# Patient Record
Sex: Female | Born: 1975 | Race: Asian | Hispanic: No | Marital: Married | Smoking: Never smoker
Health system: Southern US, Community
[De-identification: ages and names within clinical notes are randomized; demographics above are authoritative.]

## PROBLEM LIST (undated history)

## (undated) DIAGNOSIS — R7989 Other specified abnormal findings of blood chemistry: Secondary | ICD-10-CM

## (undated) DIAGNOSIS — R945 Abnormal results of liver function studies: Secondary | ICD-10-CM

## (undated) DIAGNOSIS — K8020 Calculus of gallbladder without cholecystitis without obstruction: Secondary | ICD-10-CM

## (undated) DIAGNOSIS — B1920 Unspecified viral hepatitis C without hepatic coma: Secondary | ICD-10-CM

## (undated) DIAGNOSIS — D509 Iron deficiency anemia, unspecified: Secondary | ICD-10-CM

## (undated) DIAGNOSIS — K635 Polyp of colon: Secondary | ICD-10-CM

## (undated) DIAGNOSIS — K219 Gastro-esophageal reflux disease without esophagitis: Secondary | ICD-10-CM

## (undated) HISTORY — DX: Polyp of colon: K63.5

## (undated) HISTORY — DX: Gastro-esophageal reflux disease without esophagitis: K21.9

## (undated) HISTORY — DX: Iron deficiency anemia, unspecified: D50.9

## (undated) HISTORY — DX: Other specified abnormal findings of blood chemistry: R79.89

## (undated) HISTORY — DX: Unspecified viral hepatitis C without hepatic coma: B19.20

## (undated) HISTORY — DX: Abnormal results of liver function studies: R94.5

## (undated) HISTORY — DX: Calculus of gallbladder without cholecystitis without obstruction: K80.20

---

## 1999-04-09 ENCOUNTER — Other Ambulatory Visit: Admission: RE | Admit: 1999-04-09 | Discharge: 1999-04-09 | Payer: Self-pay | Admitting: Obstetrics

## 1999-04-22 ENCOUNTER — Inpatient Hospital Stay (HOSPITAL_COMMUNITY): Admission: AD | Admit: 1999-04-22 | Discharge: 1999-04-22 | Payer: Self-pay | Admitting: Obstetrics

## 1999-08-27 ENCOUNTER — Inpatient Hospital Stay (HOSPITAL_COMMUNITY): Admission: AD | Admit: 1999-08-27 | Discharge: 1999-08-29 | Payer: Self-pay | Admitting: Obstetrics

## 2000-08-30 ENCOUNTER — Other Ambulatory Visit: Admission: RE | Admit: 2000-08-30 | Discharge: 2000-08-30 | Payer: Self-pay | Admitting: Obstetrics

## 2000-11-22 ENCOUNTER — Inpatient Hospital Stay (HOSPITAL_COMMUNITY): Admission: AD | Admit: 2000-11-22 | Discharge: 2000-11-22 | Payer: Self-pay | Admitting: Obstetrics

## 2001-01-17 ENCOUNTER — Inpatient Hospital Stay (HOSPITAL_COMMUNITY): Admission: AD | Admit: 2001-01-17 | Discharge: 2001-01-19 | Payer: Self-pay | Admitting: Obstetrics

## 2012-11-22 ENCOUNTER — Ambulatory Visit (INDEPENDENT_AMBULATORY_CARE_PROVIDER_SITE_OTHER): Payer: BC Managed Care – PPO | Admitting: Physician Assistant

## 2012-11-22 VITALS — BP 108/71 | HR 100 | Temp 98.3°F | Resp 17 | Ht 62.5 in | Wt 119.0 lb

## 2012-11-22 DIAGNOSIS — IMO0001 Reserved for inherently not codable concepts without codable children: Secondary | ICD-10-CM

## 2012-11-22 DIAGNOSIS — M791 Myalgia, unspecified site: Secondary | ICD-10-CM

## 2012-11-22 DIAGNOSIS — R509 Fever, unspecified: Secondary | ICD-10-CM

## 2012-11-22 LAB — POCT CBC
HCT, POC: 40.3 % (ref 37.7–47.9)
Hemoglobin: 12.4 g/dL (ref 12.2–16.2)
MCH, POC: 27.7 pg (ref 27–31.2)
MCV: 89.9 fL (ref 80–97)
MPV: 9.7 fL (ref 0–99.8)
RBC: 4.48 M/uL (ref 4.04–5.48)
WBC: 7.7 10*3/uL (ref 4.6–10.2)

## 2012-11-22 MED ORDER — DOXYCYCLINE HYCLATE 100 MG PO CAPS: 100.0000 mg | ORAL_CAPSULE | Freq: Two times a day (BID) | ORAL | Status: DC

## 2012-11-22 NOTE — Patient Instructions (Addendum)
Begin taking the antibiotic as directed.  Be sure to finish the full course.  It is possible that you have a viral illness that will resolve on its own, but just to be cautious we are treating you for tick borne diseases.  If any symptoms are worsening or not improving, please let us know.

## 2012-11-22 NOTE — Progress Notes (Signed)
Subjective:    Patient ID: Heidi Schneider, female    DOB: 1975/10/15, 37 y.o.   MRN: [id number]  HPI   Heidi Schneider is a pleasant 37 yr old female here with concern for illness.  States that she has had 3 days of body aches, back pain, abdominal pain, and fever.  Last night tmax 101F.  Also with chills and sweats.  All symptoms began abruptly 3 days ago.  Has intermittent HA as well.  Was camping in [place] this past weekend.  Multiple mosquito bites but no known tick bites.  Denis cough, SOB.  No nausea or vomiting, just aching in her stomach.  Appetite is down.  Has tried Dayquil and Nyquil for symptoms which helped slightly.  No rash.  No other sick contacts.  Daughter had been sick but is now better, but had different symptoms, no fever.     Review of Systems  Constitutional: Positive for fever, chills, diaphoresis and fatigue.  Respiratory: Negative for cough, shortness of breath and wheezing.   Cardiovascular: Negative.   Gastrointestinal: Positive for abdominal pain. Negative for nausea, vomiting and diarrhea.  Musculoskeletal: Positive for myalgias and arthralgias.  Skin: Negative for rash.  Neurological: Positive for headaches.       Objective:   Physical Exam  Vitals reviewed. Constitutional: She is oriented to person, place, and time. She appears well-developed and well-nourished. No distress.  HENT:  Head: Normocephalic and atraumatic.  Mouth/Throat: Uvula is midline, oropharynx is clear and moist and mucous membranes are normal.  Eyes: Conjunctivae are normal. No scleral icterus.  Neck: Neck supple.  Cardiovascular: Normal rate, regular rhythm and normal heart sounds.   Pulmonary/Chest: Effort normal and breath sounds normal. She has no wheezes. She has no rales.  Abdominal: Soft. Bowel sounds are normal. There is no tenderness.  Lymphadenopathy:    She has no cervical adenopathy.    She has no axillary adenopathy.  Neurological: She is alert and oriented to person, place, and time.   Skin: Skin is warm and dry.  Multiple insect bites on legs and back  Psychiatric: She has a normal mood and affect. Her behavior is normal.     Results for orders placed in visit on 11/22/12  POCT CBC      Result Value Range   WBC 7.7  4.6 - 10.2 K/uL   Lymph, poc 1.6  0.6 - 3.4   POC LYMPH PERCENT 20.7  10 - 50 %L   MID (cbc) 0.3  0 - 0.9   POC MID % 4.1  0 - 12 %M   POC Granulocyte 5.8  2 - 6.9   Granulocyte percent 75.2  37 - 80 %G   RBC 4.48  4.04 - 5.48 M/uL   Hemoglobin 12.4  12.2 - 16.2 g/dL   HCT, POC 66.0  63.0 - 47.9 %   MCV 89.9  80 - 97 fL   MCH, POC 27.7  27 - 31.2 pg   MCHC 30.8 (*) 31.8 - 35.4 g/dL   RDW, POC 16.0     Platelet Count, POC 220  142 - 424 K/uL   MPV 9.7  0 - 99.8 fL         Assessment & Plan:  Fever, unspecified - Plan: POCT CBC, doxycycline (VIBRAMYCIN) 100 MG capsule  Myalgia - Plan: POCT CBC, doxycycline (VIBRAMYCIN) 100 MG capsule   Heidi Schneider is a 37 yr old female with 3 days of fatigue, myalgia/arthralgia, and fever after camping in [place]  6 days ago.  Multiple mosquito bites but no known tick bite.  She is afebrile in clinic and VSS.  CBC is normal today.  Sxs could be attributable to a viral syndrome, but will be cautious and cover her for tick borne illness with doxy BID x 7 days.  Discussed RTC precautions with pt - specifically persistent fever, new symptoms.  Pt understands and is in agreement with this plan.

## 2013-08-14 ENCOUNTER — Ambulatory Visit (INDEPENDENT_AMBULATORY_CARE_PROVIDER_SITE_OTHER): Payer: BC Managed Care – PPO | Admitting: Family Medicine

## 2013-08-14 VITALS — BP 112/68 | HR 99 | Temp 98.4°F | Resp 17 | Ht 62.0 in | Wt 121.0 lb

## 2013-08-14 DIAGNOSIS — Z23 Encounter for immunization: Secondary | ICD-10-CM

## 2013-08-14 DIAGNOSIS — Z789 Other specified health status: Secondary | ICD-10-CM

## 2013-08-14 DIAGNOSIS — Z130 Encounter for screening for diseases of the blood and blood-forming organs and certain disorders involving the immune mechanism: Secondary | ICD-10-CM

## 2013-08-14 DIAGNOSIS — Z13228 Encounter for screening for other metabolic disorders: Secondary | ICD-10-CM

## 2013-08-14 DIAGNOSIS — Z1329 Encounter for screening for other suspected endocrine disorder: Secondary | ICD-10-CM

## 2013-08-14 NOTE — Progress Notes (Signed)
° °  Subjective:    Patient ID: Heidi Schneider, female    DOB: September 20, 1975, 38 y.o.   MRN: [id number] This chart was scribed for Robyn Haber, MD by Anastasia Pall, ED Scribe. This patient was seen in room 10 and the patient's care was started at 6:59 PM.  Chief Complaint  Patient presents with   Immunizations    hep c, TDAP   HPI Heidi Schneider is a 38 y.o. female Pt presents to the [facility] for Hep-A and TDAP immunizations. She denies h/o the same immunizations. She reports he is leaving the country to go to [place] in a few weeks. She denies any other requests, any symptoms.   PCP - No PCP Per Patient  There are no active problems to display for this patient.  Prior to Admission medications   Not on File   Review of Systems  Constitutional: Negative for fever and activity change.   BP 112/68   Pulse 99   Temp(Src) 98.4 F (36.9 C) (Oral)   Resp 17   Ht 5\' 2"  (1.575 m)   Wt 121 lb (54.885 kg)   BMI 22.13 kg/m2   SpO2 100%   LMP 08/07/2013     Objective:   Physical Exam Nursing note and vitals reviewed. Constitutional: She is oriented to person, place, and time. She appears well-developed and well-nourished. No distress.  HENT:  Head: Normocephalic and atraumatic.  Eyes: EOM are normal.  Neck: Neck supple.  Cardiovascular: Normal rate.   Pulmonary/Chest: Effort normal. No respiratory distress.  Musculoskeletal: Normal range of motion.  Neurological: She is alert and oriented to person, place, and time.  Skin: Skin is warm and dry.  Psychiatric: She has a normal mood and affect. Her behavior is normal.      Assessment & Plan:   Immunization due - Plan: Hepatitis A vaccine adult IM, Tdap vaccine greater than or equal to 7yo IM  Patient travels - Plan: Hepatitis A vaccine adult IM, Tdap vaccine greater than or equal to 7yo IM  Signed, Robyn Haber, MD

## 2013-08-14 NOTE — Patient Instructions (Signed)
Hepatitis A Hepatitis A antibody is produced in response to an infection with the hepatitis A virus or to the hepatitis A vaccine. The test detects the presence of this antibody. This is used to diagnose an infection with hepatitis A or to evaluate the need for or response to the hepatitis A vaccine. It is done if you have symptoms of an infection with and/or have been exposed to the hepatitis A virus (HAV); if you have chronic liver disease; or if you have received the HAV vaccine  PREPARATION FOR TEST A blood sample is obtained by inserting a needle into a vein in the arm. NORMAL FINDINGS Negative. Ranges for normal findings may bary among different laboratories and hospitals. You should always check with your doctor after having lab work or other tests done to discuss the meaning of your test results and whether your values are considered within normal limits. MEANING OF TEST  Your caregiver will go over the test results with you and discuss the importance and meaning of your results, as well as treatment options and the need for additional tests if necessary. OBTAINING THE TEST RESULTS It is your responsibility to obtain your test results. Ask the lab or department performing the test when and how you will get your results. Document Released: 06/11/2004 Document Revised: 08/09/2011 Document Reviewed: 04/27/2008 [facility] Patient Information 2014 [place], [place]. Immunization Information for Foreign Travel Immunizations can protect you from certain diseases. Immunizations can also prevent the spread of certain infections. It is important to see your caregiver or a travel medicine specialist 4 6 weeks before you travel. This allows time for vaccines to take effect. It also provides enough time for you to get vaccines that must be given in a series over a period of days or weeks. Immunizations for travelers include:  Routine vaccines. These vaccines are standard for the people in a  country.  Recommended vaccines. These vaccines are recommended before travel to some countries or regions.  Required vaccines. These vaccines are necessary before travel to specific countries or regions. If it is less than 4 weeks before you leave, you should still see your caregiver. You might still benefit from vaccines or medicines. WHAT ARE THE ROUTINE VACCINES? Routine vaccines can protect you from diseases that are common in many parts of the world. Most routine vaccines are given at specific ages during your life. However, routine vaccines also include the annual flu (influenza) vaccine. You should be up-to-date on your routine immunizations before you travel. Your caregiver will be able to review your vaccine history and determine whether you have had all the routine vaccines. You may be advised to get extra doses or booster vaccines even if you are up-to-date on the routine vaccines. WHAT ARE THE RECOMMENDED VACCINES? Know your travel schedule when you visit your caregiver. The vaccines recommended before foreign travel will depend on several factors, including:  The country or countries of travel.  Whether you will travel to rural areas.  The length of time you will be traveling.  The season of the year.  Your age.  Your health status.  Your previous immunizations. Vaccine recommendations change over time. Your caregiver can tell you what vaccines are recommended before your trip. The annual influenza vaccine sometimes differs for the [place] and [place] hemispheres. Unless the annual vaccines are the same in both hemispheres, people with certain chronic medical conditions who are traveling to the other hemisphere shortly before or during the influenza season should also get the other  influenza vaccine. The other influenza vaccine should be obtained either before leaving the country or shortly after arrival at the travel site. WHAT ARE THE REQUIRED VACCINES? Vaccines may be  required during a current outbreak of an infectious disease in a country or region. Your caregiver will be able to tell you about any current outbreaks and required vaccines. For example, proof of yellow fever immunization is currently required for most people before traveling to certain countries in [place] and [place]. This vaccine can only be obtained at approved centers. You should get the yellow fever vaccine at least 10 days before your trip. After 10 days, most people show immunity to yellow fever. If it has been longer than 10 years since you received the yellow fever vaccine, another dose is required. If proof of immunization is incomplete or inaccurate, you could be quarantined, denied entry, or given another dose of vaccine at the travel site. If you cannot receive the yellow fever vaccine because of medical reasons, you must have a written statement from your caregiver. The statement must contain a medical reason for the lack of immunization. In such a case, your caregiver should then give you advice on how to decrease your chance of getting yellow fever. That advice should include taking precautions to avoid mosquito bites and limiting outdoor time. Other than having a medical condition or being under the age of 75 months, no other reasons will be accepted for not getting the vaccine.  Proof of meningococcal immunization is required by the [place] for any person older than 2 years who is taking part in the [place] or [place]. Visas for traveling to the [place] or [place] will not even be issued until there is proof of immunization. You should get this vaccine at least 10 days before your trip. After 10 days, most people show immunity. If it has been longer than 3 years since your last immunization, another dose is required. FOR MORE INFORMATION  Centers for Disease Control and Prevention (CDC): http://www.wolf.info/  World Health Organization [facility]): RoleLink.com.br Document Released:  05/05/2009 Document Revised: 05/03/2012 Document Reviewed: 04/14/2012 [facility] Patient Information 2014 [place], [place].

## 2014-02-13 ENCOUNTER — Ambulatory Visit (INDEPENDENT_AMBULATORY_CARE_PROVIDER_SITE_OTHER): Payer: BC Managed Care – PPO

## 2014-02-13 DIAGNOSIS — Z283 Underimmunization status: Secondary | ICD-10-CM

## 2014-02-13 DIAGNOSIS — Z2839 Other underimmunization status: Secondary | ICD-10-CM

## 2014-02-13 DIAGNOSIS — Z23 Encounter for immunization: Secondary | ICD-10-CM

## 2014-02-13 NOTE — Progress Notes (Signed)
   Subjective:    Patient ID: Heidi Schneider, female    DOB: 04-26-1976, 38 y.o.   MRN: [id number]  HPI Patient was here for her 2nd Hep A shot.     Review of Systems     Objective:   Physical Exam        Assessment & Plan:

## 2014-04-26 ENCOUNTER — Ambulatory Visit (INDEPENDENT_AMBULATORY_CARE_PROVIDER_SITE_OTHER): Payer: BC Managed Care – PPO | Admitting: Family Medicine

## 2014-04-26 VITALS — BP 126/84 | HR 94 | Temp 98.1°F | Resp 16 | Ht 62.0 in | Wt 124.0 lb

## 2014-04-26 DIAGNOSIS — N390 Urinary tract infection, site not specified: Secondary | ICD-10-CM

## 2014-04-26 DIAGNOSIS — R1011 Right upper quadrant pain: Secondary | ICD-10-CM

## 2014-04-26 DIAGNOSIS — R8281 Pyuria: Secondary | ICD-10-CM

## 2014-04-26 DIAGNOSIS — R1013 Epigastric pain: Secondary | ICD-10-CM

## 2014-04-26 DIAGNOSIS — K219 Gastro-esophageal reflux disease without esophagitis: Secondary | ICD-10-CM

## 2014-04-26 DIAGNOSIS — R1012 Left upper quadrant pain: Secondary | ICD-10-CM

## 2014-04-26 LAB — POCT URINALYSIS DIPSTICK
BILIRUBIN UA: NEGATIVE
Blood, UA: NEGATIVE
GLUCOSE UA: NEGATIVE
KETONES UA: NEGATIVE
NITRITE UA: NEGATIVE
PH UA: 6.5
Protein, UA: NEGATIVE
Spec Grav, UA: 1.02
Urobilinogen, UA: 0.2

## 2014-04-26 LAB — COMPREHENSIVE METABOLIC PANEL
ALBUMIN: 4.1 g/dL (ref 3.5–5.2)
ALK PHOS: 211 U/L — AB (ref 39–117)
ALT: 204 U/L — AB (ref 0–35)
AST: 79 U/L — AB (ref 0–37)
BUN: 10 mg/dL (ref 6–23)
CALCIUM: 9.1 mg/dL (ref 8.4–10.5)
CHLORIDE: 105 meq/L (ref 96–112)
CO2: 27 mEq/L (ref 19–32)
Creat: 0.54 mg/dL (ref 0.50–1.10)
Glucose, Bld: 100 mg/dL — ABNORMAL HIGH (ref 70–99)
POTASSIUM: 4.1 meq/L (ref 3.5–5.3)
SODIUM: 138 meq/L (ref 135–145)
TOTAL PROTEIN: 7.3 g/dL (ref 6.0–8.3)
Total Bilirubin: 0.6 mg/dL (ref 0.2–1.2)

## 2014-04-26 LAB — POCT CBC
Granulocyte percent: 58.9 %G (ref 37–80)
HEMATOCRIT: 31.4 % — AB (ref 37.7–47.9)
HEMOGLOBIN: 9.6 g/dL — AB (ref 12.2–16.2)
LYMPH, POC: 1.6 (ref 0.6–3.4)
MCH: 22.6 pg — AB (ref 27–31.2)
MCHC: 30.7 g/dL — AB (ref 31.8–35.4)
MCV: 73.6 fL — AB (ref 80–97)
MID (cbc): 0.3 (ref 0–0.9)
MPV: 9 fL (ref 0–99.8)
POC Granulocyte: 2.8 (ref 2–6.9)
POC LYMPH PERCENT: 34 %L (ref 10–50)
POC MID %: 7.1 %M (ref 0–12)
Platelet Count, POC: 352 10*3/uL (ref 142–424)
RBC: 4.27 M/uL (ref 4.04–5.48)
RDW, POC: 19.7 %
WBC: 4.8 10*3/uL (ref 4.6–10.2)

## 2014-04-26 LAB — POCT UA - MICROSCOPIC ONLY
CASTS, UR, LPF, POC: NEGATIVE
CRYSTALS, UR, HPF, POC: NEGATIVE
Mucus, UA: NEGATIVE
RBC, urine, microscopic: NEGATIVE
YEAST UA: NEGATIVE

## 2014-04-26 LAB — POCT URINE PREGNANCY: PREG TEST UR: NEGATIVE

## 2014-04-26 LAB — LIPASE: Lipase: 22 U/L (ref 0–75)

## 2014-04-26 MED ORDER — OMEPRAZOLE 20 MG PO CPDR: 20.0000 mg | DELAYED_RELEASE_CAPSULE | Freq: Every day | ORAL | Status: DC

## 2014-04-26 NOTE — Patient Instructions (Addendum)
Your pain appears to be from stomach and heartburn. Start stronger medicine - omeprazole once per day instead of Zantac. Avoid foods below that can worsen heartburn and stomach pain. You should receive a call or letter about your lab results within the next week to 10 days. If pain not improving in next 3-4 days, or worse sooner - Return to the clinic or go to the nearest emergency room.   I will also check a urine culture as a few infection cells in urine, but your symptoms do not appear to be from infection in urine at this time.    Gastritis, Adult Gastritis is soreness and swelling (inflammation) of the lining of the stomach. Gastritis can develop as a sudden onset (acute) or long-term (chronic) condition. If gastritis is not treated, it can lead to stomach bleeding and ulcers. CAUSES  Gastritis occurs when the stomach lining is weak or damaged. Digestive juices from the stomach then inflame the weakened stomach lining. The stomach lining may be weak or damaged due to viral or bacterial infections. One common bacterial infection is the Helicobacter pylori infection. Gastritis can also result from excessive alcohol consumption, taking certain medicines, or having too much acid in the stomach.  SYMPTOMS  In some cases, there are no symptoms. When symptoms are present, they may include:  Pain or a burning sensation in the upper abdomen.  Nausea.  Vomiting.  An uncomfortable feeling of fullness after eating. DIAGNOSIS  Your caregiver may suspect you have gastritis based on your symptoms and a physical exam. To determine the cause of your gastritis, your caregiver may perform the following:  Blood or stool tests to check for the H pylori bacterium.  Gastroscopy. A thin, flexible tube (endoscope) is passed down the esophagus and into the stomach. The endoscope has a light and camera on the end. Your caregiver uses the endoscope to view the inside of the stomach.  Taking a tissue sample  (biopsy) from the stomach to examine under a microscope. TREATMENT  Depending on the cause of your gastritis, medicines may be prescribed. If you have a bacterial infection, such as an H pylori infection, antibiotics may be given. If your gastritis is caused by too much acid in the stomach, H2 blockers or antacids may be given. Your caregiver may recommend that you stop taking aspirin, ibuprofen, or other nonsteroidal anti-inflammatory drugs (NSAIDs). HOME CARE INSTRUCTIONS  Only take over-the-counter or prescription medicines as directed by your caregiver.  If you were given antibiotic medicines, take them as directed. Finish them even if you start to feel better.  Drink enough fluids to keep your urine clear or pale yellow.  Avoid foods and drinks that make your symptoms worse, such as:  Caffeine or alcoholic drinks.  Chocolate.  Peppermint or mint flavorings.  Garlic and onions.  Spicy foods.  Citrus fruits, such as oranges, lemons, or limes.  Tomato-based foods such as sauce, chili, salsa, and pizza.  Fried and fatty foods.  Eat small, frequent meals instead of large meals. SEEK IMMEDIATE MEDICAL CARE IF:   You have black or dark red stools.  You vomit blood or material that looks like coffee grounds.  You are unable to keep fluids down.  Your abdominal pain gets worse.  You have a fever.  You do not feel better after 1 week.  You have any other questions or concerns. MAKE SURE YOU:  Understand these instructions.  Will watch your condition.  Will get help right away if you are  not doing well or get worse. Document Released: 05/11/2001 Document Revised: 11/16/2011 Document Reviewed: 06/30/2011 [facility] Patient Information 2015 [place], [place]. This information is not intended to replace advice given to you by your health care provider. Make sure you discuss any questions you have with your health care provider.  Food Choices for Gastroesophageal Reflux  Disease When you have gastroesophageal reflux disease (GERD), the foods you eat and your eating habits are very important. Choosing the right foods can help ease the discomfort of GERD. WHAT GENERAL GUIDELINES DO I NEED TO FOLLOW?  Choose fruits, vegetables, whole grains, low-fat dairy products, and low-fat meat, fish, and poultry.  Limit fats such as oils, salad dressings, butter, nuts, and avocado.  Keep a food diary to identify foods that cause symptoms.  Avoid foods that cause reflux. These may be different for different people.  Eat frequent small meals instead of three large meals each day.  Eat your meals slowly, in a relaxed setting.  Limit fried foods.  Cook foods using methods other than frying.  Avoid drinking alcohol.  Avoid drinking large amounts of liquids with your meals.  Avoid bending over or lying down until 2-3 hours after eating. WHAT FOODS ARE NOT RECOMMENDED? The following are some foods and drinks that may worsen your symptoms: Vegetables Tomatoes. Tomato juice. Tomato and spaghetti sauce. Chili peppers. Onion and garlic. Horseradish. Fruits Oranges, grapefruit, and lemon (fruit and juice). Meats High-fat meats, fish, and poultry. This includes hot dogs, ribs, ham, sausage, salami, and bacon. Dairy Whole milk and chocolate milk. Sour cream. Cream. Butter. Ice cream. Cream cheese.  Beverages Coffee and tea, with or without caffeine. Carbonated beverages or energy drinks. Condiments Hot sauce. Barbecue sauce.  Sweets/Desserts Chocolate and cocoa. Donuts. Peppermint and spearmint. Fats and Oils High-fat foods, including [place] fries and potato chips. Other Vinegar. Strong spices, such as black pepper, white pepper, red pepper, cayenne, curry powder, cloves, ginger, and chili powder. The items listed above may not be a complete list of foods and beverages to avoid. Contact your dietitian for more information. Document Released: 05/17/2005 Document  Revised: 05/22/2013 Document Reviewed: 03/21/2013 [facility] Patient Information 2015 [place], [place]. This information is not intended to replace advice given to you by your health care provider. Make sure you discuss any questions you have with your health care provider.

## 2014-04-26 NOTE — Progress Notes (Addendum)
Subjective:    Patient ID: Heidi Schneider, female    DOB: 24-Oct-1975, 38 y.o.   MRN: [id number] This chart was scribed for Dr. Merri Ray by Steva Colder, ED Scribe. The patient was seen in room 8 at 1:54 PM.   Chief Complaint  Patient presents with  . Abdominal Pain    x1 week     HPI Heidi Schneider is a 38 y.o. female who presents today complaining of abdominal pain onset 1 week. She thinks that she had an upset stomach and she took Zantec and the stomach acid went away. She tried this method this time and it wouldn't go away. The pain is in the upper abdomen and in the back the pain is worse in the afternoon. She has been able to eat and drink okay. She notes that she has had some heartburn.  She states that she has tried Zantec 1 a day and TUMS with no relief for her symptoms. She denies fever, dark tarry stools, blood in stools, nausea, vomiting, dysuria, weight loss, diaphoresis, and any other associate symptoms. She denies stomach ulcers. She denies any sick contacts with similar issues. Denies being treated for H. Pylori in the past. Denies diet change. No recent travel in the past month. Denies new foods or raw foods lately.  Patient's last menstrual period was 04/11/2014 and was normal, she denies pregnancy.      There are no active problems to display for this patient.  History reviewed. No pertinent past medical history. History reviewed. No pertinent past surgical history. No Known Allergies Prior to Admission medications   Not on File   History   Social History  . Marital Status: Married    Spouse Name: N/A    Number of Children: N/A  . Years of Education: N/A   Occupational History  . Not on file.   Social History Main Topics  . Smoking status: Never Smoker   . Smokeless tobacco: Not on file  . Alcohol Use: No  . Drug Use: No  . Sexual Activity: Yes    Birth Control/ Protection: None   Other Topics Concern  . Not on file   Social History Narrative        Review of Systems  Constitutional: Negative for fever, diaphoresis and unexpected weight change.  Gastrointestinal: Positive for abdominal pain. Negative for nausea, vomiting and blood in stool.       No dark tarry stools  Genitourinary: Negative for dysuria.       Objective:   Physical Exam  Constitutional: She is oriented to person, place, and time. She appears well-developed and well-nourished.  HENT:  Head: Normocephalic and atraumatic.  Eyes: Conjunctivae and EOM are normal. Pupils are equal, round, and reactive to light.  Neck: Carotid bruit is not present.  Cardiovascular: Normal rate, regular rhythm, normal heart sounds and intact distal pulses.   Pulmonary/Chest: Effort normal and breath sounds normal.  Abdominal: Soft. She exhibits no pulsatile midline mass. There is tenderness in the right upper quadrant, epigastric area and left upper quadrant. There is no rebound, no guarding, no tenderness at McBurney's point and negative Murphy's sign.  Slight tenderness over the epigastric to RUQ and slightly LUQ. No lower abdominal tenderness.   Musculoskeletal:  Minimal tenderness of the paraspinal muscles. No midline tenderness or bony tenderness.   Neurological: She is alert and oriented to person, place, and time.  Skin: Skin is warm and dry. No rash noted.  Psychiatric: She has  a normal mood and affect. Her behavior is normal.  Vitals reviewed.        Filed Vitals:   04/26/14 1309  BP: 126/84  Pulse: 94  Temp: 98.1 F (36.7 C)  TempSrc: Oral  Resp: 16  Height: 5\' 2"  (1.575 m)  Weight: 124 lb (56.246 kg)  SpO2: 100%   Results for orders placed or performed in visit on 04/26/14  POCT CBC  Result Value Ref Range   WBC 4.8 4.6 - 10.2 K/uL   Lymph, poc 1.6 0.6 - 3.4   POC LYMPH PERCENT 34.0 10 - 50 %L   MID (cbc) 0.3 0 - 0.9   POC MID % 7.1 0 - 12 %M   POC Granulocyte 2.8 2 - 6.9   Granulocyte percent 58.9 37 - 80 %G   RBC 4.27 4.04 - 5.48 M/uL    Hemoglobin 9.6 (A) 12.2 - 16.2 g/dL   HCT, POC 31.4 (A) 37.7 - 47.9 %   MCV 73.6 (A) 80 - 97 fL   MCH, POC 22.6 (A) 27 - 31.2 pg   MCHC 30.7 (A) 31.8 - 35.4 g/dL   RDW, POC 19.7 %   Platelet Count, POC 352 142 - 424 K/uL   MPV 9.0 0 - 99.8 fL  POCT urinalysis dipstick  Result Value Ref Range   Color, UA yellow    Clarity, UA clear    Glucose, UA neg    Bilirubin, UA neg    Ketones, UA neg    Spec Grav, UA 1.020    Blood, UA neg    pH, UA 6.5    Protein, UA neg    Urobilinogen, UA 0.2    Nitrite, UA neg    Leukocytes, UA small (1+)   POCT UA - Microscopic Only  Result Value Ref Range   WBC, Ur, HPF, POC 2-6    RBC, urine, microscopic neg    Bacteria, U Microscopic 2+    Mucus, UA neg    Epithelial cells, urine per micros 0-3    Crystals, Ur, HPF, POC neg    Casts, Ur, LPF, POC neg    Yeast, UA neg   POCT urine pregnancy  Result Value Ref Range   Preg Test, Ur Negative     Assessment & Plan:  COORDINATION OF CARE: 2:02 PM-Discussed treatment plan which includes CBC, H. Pylori IGM, and UA, with pt at bedside and pt agreed to plan.  [name] is a 38 y.o. female Epigastric abdominal pain - Plan: POCT CBC, POCT urinalysis dipstick, H. pylori antibody, IgG, Comprehensive metabolic panel, Lipase, omeprazole (PRILOSEC) 20 MG capsule, POCT urine pregnancy  RUQ pain - Plan: POCT CBC, POCT urinalysis dipstick, Lipase, POCT UA - Microscopic Only, POCT urine pregnancy  LUQ pain - Plan: POCT CBC, POCT urinalysis dipstick, Lipase, POCT UA - Microscopic Only, POCT urine pregnancy  Gastroesophageal reflux disease, esophagitis presence not specified - Plan: POCT CBC, POCT urinalysis dipstick, H. pylori antibody, IgG, Comprehensive metabolic panel, Lipase, omeprazole (PRILOSEC) 20 MG capsule  Pyuria - Plan: Urine culture  Suspected gastritis with some coexisting GERD. Afebrile. Change H2 blocker to PPI, avoidance of trigger foods as noted on patient instructions, and check lipase,  CMP, and H pylori. Follow up to be determined by labs. RTC/ER precautions discussed.   Pyuria - ? Contaminant as no other UTI sx's. Check culture, hold on antibiotics at this time.   Meds ordered this encounter  Medications  . omeprazole (PRILOSEC) 20 MG capsule  Sig: Take 1 capsule (20 mg total) by mouth daily.    Dispense:  30 capsule    Refill:  1   Patient Instructions  Your pain appears to be from stomach and heartburn. Start stronger medicine - omeprazole once per day instead of Zantac. Avoid foods below that can worsen heartburn and stomach pain. You should receive a call or letter about your lab results within the next week to 10 days. If pain not improving in next 3-4 days, or worse sooner - Return to the clinic or go to the nearest emergency room.   I will also check a urine culture as a few infection cells in urine, but your symptoms do not appear to be from infection in urine at this time.    Gastritis, Adult Gastritis is soreness and swelling (inflammation) of the lining of the stomach. Gastritis can develop as a sudden onset (acute) or long-term (chronic) condition. If gastritis is not treated, it can lead to stomach bleeding and ulcers. CAUSES  Gastritis occurs when the stomach lining is weak or damaged. Digestive juices from the stomach then inflame the weakened stomach lining. The stomach lining may be weak or damaged due to viral or bacterial infections. One common bacterial infection is the Helicobacter pylori infection. Gastritis can also result from excessive alcohol consumption, taking certain medicines, or having too much acid in the stomach.  SYMPTOMS  In some cases, there are no symptoms. When symptoms are present, they may include:  Pain or a burning sensation in the upper abdomen.  Nausea.  Vomiting.  An uncomfortable feeling of fullness after eating. DIAGNOSIS  Your caregiver may suspect you have gastritis based on your symptoms and a physical exam. To  determine the cause of your gastritis, your caregiver may perform the following:  Blood or stool tests to check for the H pylori bacterium.  Gastroscopy. A thin, flexible tube (endoscope) is passed down the esophagus and into the stomach. The endoscope has a light and camera on the end. Your caregiver uses the endoscope to view the inside of the stomach.  Taking a tissue sample (biopsy) from the stomach to examine under a microscope. TREATMENT  Depending on the cause of your gastritis, medicines may be prescribed. If you have a bacterial infection, such as an H pylori infection, antibiotics may be given. If your gastritis is caused by too much acid in the stomach, H2 blockers or antacids may be given. Your caregiver may recommend that you stop taking aspirin, ibuprofen, or other nonsteroidal anti-inflammatory drugs (NSAIDs). HOME CARE INSTRUCTIONS  Only take over-the-counter or prescription medicines as directed by your caregiver.  If you were given antibiotic medicines, take them as directed. Finish them even if you start to feel better.  Drink enough fluids to keep your urine clear or pale yellow.  Avoid foods and drinks that make your symptoms worse, such as:  Caffeine or alcoholic drinks.  Chocolate.  Peppermint or mint flavorings.  Garlic and onions.  Spicy foods.  Citrus fruits, such as oranges, lemons, or limes.  Tomato-based foods such as sauce, chili, salsa, and pizza.  Fried and fatty foods.  Eat small, frequent meals instead of large meals. SEEK IMMEDIATE MEDICAL CARE IF:   You have black or dark red stools.  You vomit blood or material that looks like coffee grounds.  You are unable to keep fluids down.  Your abdominal pain gets worse.  You have a fever.  You do not feel better after 1 week.  You have any other questions or concerns. MAKE SURE YOU:  Understand these instructions.  Will watch your condition.  Will get help right away if you are not  doing well or get worse. Document Released: 05/11/2001 Document Revised: 11/16/2011 Document Reviewed: 06/30/2011 [facility] Patient Information 2015 [place], [place]. This information is not intended to replace advice given to you by your health care provider. Make sure you discuss any questions you have with your health care provider.  Food Choices for Gastroesophageal Reflux Disease When you have gastroesophageal reflux disease (GERD), the foods you eat and your eating habits are very important. Choosing the right foods can help ease the discomfort of GERD. WHAT GENERAL GUIDELINES DO I NEED TO FOLLOW?  Choose fruits, vegetables, whole grains, low-fat dairy products, and low-fat meat, fish, and poultry.  Limit fats such as oils, salad dressings, butter, nuts, and avocado.  Keep a food diary to identify foods that cause symptoms.  Avoid foods that cause reflux. These may be different for different people.  Eat frequent small meals instead of three large meals each day.  Eat your meals slowly, in a relaxed setting.  Limit fried foods.  Cook foods using methods other than frying.  Avoid drinking alcohol.  Avoid drinking large amounts of liquids with your meals.  Avoid bending over or lying down until 2-3 hours after eating. WHAT FOODS ARE NOT RECOMMENDED? The following are some foods and drinks that may worsen your symptoms: Vegetables Tomatoes. Tomato juice. Tomato and spaghetti sauce. Chili peppers. Onion and garlic. Horseradish. Fruits Oranges, grapefruit, and lemon (fruit and juice). Meats High-fat meats, fish, and poultry. This includes hot dogs, ribs, ham, sausage, salami, and bacon. Dairy Whole milk and chocolate milk. Sour cream. Cream. Butter. Ice cream. Cream cheese.  Beverages Coffee and tea, with or without caffeine. Carbonated beverages or energy drinks. Condiments Hot sauce. Barbecue sauce.  Sweets/Desserts Chocolate and cocoa. Donuts. Peppermint and  spearmint. Fats and Oils High-fat foods, including [place] fries and potato chips. Other Vinegar. Strong spices, such as black pepper, white pepper, red pepper, cayenne, curry powder, cloves, ginger, and chili powder. The items listed above may not be a complete list of foods and beverages to avoid. Contact your dietitian for more information. Document Released: 05/17/2005 Document Revised: 05/22/2013 Document Reviewed: 03/21/2013 [facility] Patient Information 2015 [place], [place]. This information is not intended to replace advice given to you by your health care provider. Make sure you discuss any questions you have with your health care provider.        I personally performed the services described in this documentation, which was scribed in my presence. The recorded information has been reviewed and considered, and addended by me as needed.

## 2014-04-28 LAB — URINE CULTURE
Colony Count: NO GROWTH
Organism ID, Bacteria: NO GROWTH

## 2014-04-29 LAB — H. PYLORI ANTIBODY, IGG: H Pylori IgG: 0.42 {ISR}

## 2014-05-01 ENCOUNTER — Ambulatory Visit (INDEPENDENT_AMBULATORY_CARE_PROVIDER_SITE_OTHER): Payer: BC Managed Care – PPO | Admitting: Family Medicine

## 2014-05-01 VITALS — BP 109/72 | HR 77 | Temp 98.2°F | Resp 18 | Ht 61.75 in | Wt 123.0 lb

## 2014-05-01 DIAGNOSIS — R7989 Other specified abnormal findings of blood chemistry: Secondary | ICD-10-CM

## 2014-05-01 DIAGNOSIS — Z124 Encounter for screening for malignant neoplasm of cervix: Secondary | ICD-10-CM

## 2014-05-01 DIAGNOSIS — D649 Anemia, unspecified: Secondary | ICD-10-CM

## 2014-05-01 DIAGNOSIS — Z13220 Encounter for screening for lipoid disorders: Secondary | ICD-10-CM

## 2014-05-01 DIAGNOSIS — R945 Abnormal results of liver function studies: Secondary | ICD-10-CM

## 2014-05-01 DIAGNOSIS — Z0000 Encounter for general adult medical examination without abnormal findings: Secondary | ICD-10-CM

## 2014-05-01 DIAGNOSIS — R1013 Epigastric pain: Secondary | ICD-10-CM

## 2014-05-01 LAB — POCT CBC
Granulocyte percent: 51.5 %G (ref 37–80)
HCT, POC: 30 % — AB (ref 37.7–47.9)
Hemoglobin: 9.1 g/dL — AB (ref 12.2–16.2)
Lymph, poc: 1.8 (ref 0.6–3.4)
MCH, POC: 22.3 pg — AB (ref 27–31.2)
MCHC: 30.4 g/dL — AB (ref 31.8–35.4)
MCV: 73.2 fL — AB (ref 80–97)
MID (CBC): 0.2 (ref 0–0.9)
MPV: 8.9 fL (ref 0–99.8)
PLATELET COUNT, POC: 328 10*3/uL (ref 142–424)
POC GRANULOCYTE: 2.2 (ref 2–6.9)
POC LYMPH PERCENT: 42.9 %L (ref 10–50)
POC MID %: 5.6 % (ref 0–12)
RBC: 4.1 M/uL (ref 4.04–5.48)
RDW, POC: 19.7 %
WBC: 4.2 10*3/uL — AB (ref 4.6–10.2)

## 2014-05-01 NOTE — Progress Notes (Addendum)
This chart was scribed for Merri Ray, MD by Edison Simon, ED Scribe. This patient was seen in room 14 and the patient's care was started at 5:23 PM.   Subjective:    Patient ID: Heidi Schneider, female    DOB: 11/20/1975, 38 y.o.   MRN: [id number]  Chief Complaint  Patient presents with  . Annual Exam    HPI  HPI Comments: Heidi Schneider is a 38 y.o. female who presents to the [facility] for follow up for abdominal pain. She was seen 5 days ago with abdominal pain for 1 week. Similar symptoms in the past have resolved with Zantac. Eating and drinking fine at that time but did have some heartburn. Denies history of PUD. Denies sick contacts. No other associated symptoms. See prior note for details. She was started on Omeprazole 7m once a day. I called her with results 2 days ago as her LFTs were elevated on her CMP as well as alk phos, see labs below. Additionally, her hemoglobin had decreased from 12.4 1 year ago to 9.6 at last visit. Denied any dark or tarry stools, lightheadedness or dizziness, and on phone call her pain had improved.    Chemistry      Component Value Date/Time   NA 138 04/26/2014 1411   K 4.1 04/26/2014 1411   CL 105 04/26/2014 1411   CO2 27 04/26/2014 1411   BUN 10 04/26/2014 1411   CREATININE 0.54 04/26/2014 1411      Component Value Date/Time   CALCIUM 9.1 04/26/2014 1411   ALKPHOS 211* 04/26/2014 1411   AST 79* 04/26/2014 1411   ALT 204* 04/26/2014 1411   BILITOT 0.6 04/26/2014 1411       She is here for follow up. She states she felt well when called 2 days ago, but she decided not to take her Omeprazole that day and her pain returned the day after. She locates it to her upper abdomen. She states she took Omeprazole yesterday and today and her pain resolved today.  She denies nausea, vomiting, fever, diarrhea, change in skin color, change in eye color. She denies history of liver problems. [place] She denies having any illness while there and states she had a hepatitis A vaccine before and after her trip, in March and September. She denies prior anemia or low blood count. She denies lightheadedness or dizziness. Menses regular, 4-5 days, no recent change in amount of bleeding or heavy bleeding.   She also requests physical exam. She states she has not had a physical in a couple years. She states she feels completely normal today. She states she has not had a mammogram. She denies FHx of breast cancer. She states she checks for lumps in her breast at home infrequently and has never noticed anything abnormal. After discussion, she declines mammogram at this time, prefers to wait a few years. She states her last pap smear was few years ago and normal then. she agrees to have one here. She denies abnormal vaginal bleeding. She states she is still having periods once a month, last on 11/12. She state she has 2 children and has been pregnant twice; her children at 89and 89 She states this are going well at home. She states she is married and denies extramarital partners. She denies significant family history of medical problems. 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  Immunizations: No flu shot this year, counseled her on the benefit of one and she states she sill consider it. TDAP in March of this year, hepatitis A as above. Dental: Does not have a regular dentist, states she has not seen one in a while. Vision: Does not wear glasses or contacts, has not had an eye exam with an eye doctor in the past few years. Exercise: Does not exercise, advised her to exercise at least 150 minutes a week and gave suggestions on how to do that.     PCP: No PCP Per Patient  There are no active problems to display for this patient.  No past medical history on file. No past surgical history on file. No Known Allergies Prior to Admission medications   Medication Sig Start  Date End Date Taking? Authorizing Provider  omeprazole (PRILOSEC) 20 MG capsule Take 1 capsule (20 mg total) by mouth daily. 04/26/14  Yes Wendie Agreste, MD   History   Social History  . Marital Status: Married    Spouse Name: N/A    Number of Children: N/A  . Years of Education: N/A   Occupational History  . Not on file.   Social History Main Topics  . Smoking status: Never Smoker   . Smokeless tobacco: Not on file  . Alcohol Use: No  . Drug Use: No  . Sexual Activity: Yes    Birth Control/ Protection: None   Other Topics Concern  . Not on file   Social History Narrative      Review of Systems  Constitutional: Negative for fever.  Gastrointestinal: Negative for nausea, vomiting, abdominal pain and diarrhea.  Genitourinary: Negative for vaginal bleeding.  Neurological: Negative for dizziness and light-headedness.  All other systems reviewed and are negative. 13 point ROS reviewed per patient health survey, no positive responses     Objective:   Physical Exam  Constitutional: She is oriented to person, place, and time. She appears well-developed and well-nourished.  HENT:  Head: Normocephalic and atraumatic.  Right Ear: External ear normal.  Left Ear: External ear normal.  Mouth/Throat: Oropharynx is clear and moist.  Eyes: Conjunctivae are normal. Pupils are equal, round, and reactive to light.  No scleral icterus.   Neck: Normal range of motion. Neck supple. No thyromegaly present.  Cardiovascular: Normal rate, regular rhythm, normal heart sounds and intact distal pulses.   No murmur heard. Pulmonary/Chest: Effort normal and breath sounds normal. No respiratory distress. She has no wheezes.  Abdominal: Soft. Bowel sounds are normal. There is no hepatosplenomegaly. There is no tenderness. There is no CVA tenderness, no tenderness at McBurney's point and negative Murphy's sign.  Genitourinary: Cervix exhibits friability. Cervix exhibits no motion tenderness and  no discharge. Right adnexum displays no tenderness. Left adnexum displays no tenderness. No tenderness in the vagina. No vaginal discharge found.  Shallow vagina, with cervix at uppermost part, possible uterine enlargement but nontender on bimanual exam.  Slight bleeding at cervix with pap collection. No ttp on exam.   Musculoskeletal: Normal range of motion. She exhibits no edema or tenderness.  Lymphadenopathy:    She has no cervical adenopathy.  Neurological: She is alert and oriented to person, place, and time.  Skin: Skin is warm and dry. No rash noted.  No jaundice.   Psychiatric: She has a normal mood and affect. Her behavior is normal. Thought content normal.  Vitals reviewed.   Filed Vitals:   05/01/14 1712  BP: 109/72  Pulse:  77  Temp: 98.2 F (36.8 C)  TempSrc: Oral  Resp: 18  Height: 5' 1.75" (1.568 m)  Weight: 123 lb (55.792 kg)  SpO2: 100%    Visual Acuity Screening   Right eye Left eye Both eyes  Without correction: 20/20 20/20 20/15-1  With correction:      Results for orders placed or performed in visit on 05/01/14  POCT CBC  Result Value Ref Range   WBC 4.2 (A) 4.6 - 10.2 K/uL   Lymph, poc 1.8 0.6 - 3.4   POC LYMPH PERCENT 42.9 10 - 50 %L   MID (cbc) 0.2 0 - 0.9   POC MID % 5.6 0 - 12 %M   POC Granulocyte 2.2 2 - 6.9   Granulocyte percent 51.5 37 - 80 %G   RBC 4.10 4.04 - 5.48 M/uL   Hemoglobin 9.1 (A) 12.2 - 16.2 g/dL   HCT, POC 30.0 (A) 37.7 - 47.9 %   MCV 73.2 (A) 80 - 97 fL   MCH, POC 22.3 (A) 27 - 31.2 pg   MCHC 30.4 (A) 31.8 - 35.4 g/dL   RDW, POC 19.7 %   Platelet Count, POC 328 142 - 424 K/uL   MPV 8.9 0 - 99.8 fL       Assessment & Plan:   Heidi Schneider is a 38 y.o. female Annual physical exam - Plan: HIV antibody  - -anticipatory guidance as below in AVS, screening labs above. Health maintenance items as above in HPI discussed/recommended as applicable.   Screening for hyperlipidemia - Plan: Lipid panel  Screening for cervical cancer  - Plan: Pap IG w/ reflex to HPV when ASC-U  Abdominal pain, epigastric - Plan: POCT CBC, Comprehensive metabolic panel, IBC Panel, Elevated LFTs - Plan: Hepatitis panel, acute  -improved on PPI and with pain returning yesterday off PPI, suspected gastritis vs PUD. Pain free today. VSS. Also noted elevated LFT's and alk phos - hepatitis vs cholangitis vs cholecystitis, but no RUQ pain, no N/V. Will repeat LFT's, check acute hepatitis panel, continue PPI and if pain returns or if LFT"S remain elevated, would check abdominal U/S. RTC/Er precautions discussed if worsening. Understanding expressed.   Anemia, unspecified anemia type - Plan: IBC Panel  -VSS. Microcytic, appears to be iron deficiency. Possible fibroid by pelvic exam, but denies heavy bleeding. With abd pain, consider PUD, but denies dark/tarry stools and pain free at present time. Check iron testing, start iron QD, and recheck in next 1 week, sooner if symptomatic.   No orders of the defined types were placed in this encounter.   Patient Instructions  You should receive a call or letter about your lab results within the next week to 10 days.  If liver tests not improved, may need ultrasound of abdomen. Keep taking the omeprazole once per day. Return to the clinic or go to the nearest emergency room if any of your symptoms worsen or new symptoms occur. Exercise as discussed, and obtain appointment with dentist and optometry or ophthalmologist as recommended for screening exam.   Your blood count was still low (anemia). We will check blood test for iron deficiency, but we need to repeat this in next week to make sure it is not lower.  If you are lightheaded, dizzy, or feel weak before that time - return here or emergency room. Start ferrous sulfate 345m (over the counter iron supplement) once per day for now.  Based on your pelvic exam, we may need to check a  pelvic ultrasound to see if you have fibroids, that can cause heavy bleeding and  anemia.   Keeping You Healthy  Get These Tests  Blood Pressure- Have your blood pressure checked once a year by your health care provider.  Normal blood pressure is 120/80.  Weight- Have your body mass index (BMI) calculated to screen for obesity.  BMI is measure of body fat based on height and weight.  You can also calculate your own BMI at GravelBags.it.  Cholesterol- Have your cholesterol checked every 5 years starting at age 80 then yearly starting at age 64.  Chlamydia, HIV, and other sexually transmitted diseases- Get screened every year until age 107, then within three months of each new sexual provider.  Pap Smear- Every 1-3 years; discuss with your health care provider.  Mammogram- Every year starting at age 53  Take these medicines  Calcium with Vitamin D-Your body needs 1200 mg of Calcium each day and [phone] IU of Vitamin D daily.  Your body can only absorb 500 mg of Calcium at a time so Calcium must be taken in 2 or 3 divided doses throughout the day.  Multivitamin with folic acid- Once daily if it is possible for you to become pregnant.  Get these Immunizations  Gardasil-Series of three doses; prevents HPV related illness such as genital warts and cervical cancer.  Menactra-Single dose; prevents meningitis.  Tetanus shot- Every 10 years.  Flu shot-Every year.  Take these steps 1. Do not smoke-Your healthcare provider can help you quit.  For tips on how to quit go to www.smokefree.gov or call 1-800 QUITNOW. 2. Be physically active- Exercise 5 days a week for at least 30 minutes.  If you are not already physically active, start slow and gradually work up to 30 minutes of moderate physical activity.  Examples of moderate activity include walking briskly, dancing, swimming, bicycling, etc. 3. Breast Cancer- A self breast exam every month is important for early detection of breast cancer.  For more information and instruction on self breast exams, ask your  healthcare provider or https://www.patel.info/. 4. Eat a healthy diet- Eat a variety of healthy foods such as fruits, vegetables, whole grains, low fat milk, low fat cheeses, yogurt, lean meats, poultry and fish, beans, nuts, tofu, etc.  For more information go to www. Thenutritionsource.org 5. Drink alcohol in moderation- Limit alcohol intake to one drink or less per day. Never drink and drive. 6. Depression- Your emotional health is as important as your physical health.  If you're feeling down or losing interest in things you normally enjoy please talk to your healthcare provider about being screened for depression. 7. Dental visit- Brush and floss your teeth twice daily; visit your dentist twice a year. 8. Eye doctor- Get an eye exam at least every 2 years. 9. Helmet use- Always wear a helmet when riding a bicycle, motorcycle, rollerblading or skateboarding. 32. Safe sex- If you may be exposed to sexually transmitted infections, use a condom. 11. Seat belts- Seat belts can save your live; always wear one. 12. Smoke/Carbon Monoxide detectors- These detectors need to be installed on the appropriate level of your home. Replace batteries at least once a year. 13. Skin cancer- When out in the sun please cover up and use sunscreen 15 SPF or higher. 14. Violence- If anyone is threatening or hurting you, please tell your healthcare provider.  Anemia, Nonspecific Anemia is a condition in which the concentration of red blood cells or hemoglobin in the blood is below  normal. Hemoglobin is a substance in red blood cells that carries oxygen to the tissues of the body. Anemia results in not enough oxygen reaching these tissues.  CAUSES  Common causes of anemia include:   Excessive bleeding. Bleeding may be internal or external. This includes excessive bleeding from periods (in women) or from the intestine.   Poor nutrition.   Chronic kidney, thyroid, and liver disease.  Bone  marrow disorders that decrease red blood cell production.  Cancer and treatments for cancer.  HIV, AIDS, and their treatments.  Spleen problems that increase red blood cell destruction.  Blood disorders.  Excess destruction of red blood cells due to infection, medicines, and autoimmune disorders. SIGNS AND SYMPTOMS   Minor weakness.   Dizziness.   Headache.  Palpitations.   Shortness of breath, especially with exercise.   Paleness.  Cold sensitivity.  Indigestion.  Nausea.  Difficulty sleeping.  Difficulty concentrating. Symptoms may occur suddenly or they may develop slowly.  DIAGNOSIS  Additional blood tests are often needed. These help your health care provider determine the best treatment. Your health care provider will check your stool for blood and look for other causes of blood loss.  TREATMENT  Treatment varies depending on the cause of the anemia. Treatment can include:   Supplements of iron, vitamin F16, or folic acid.   Hormone medicines.   A blood transfusion. This may be needed if blood loss is severe.   Hospitalization. This may be needed if there is significant continual blood loss.   Dietary changes.  Spleen removal. HOME CARE INSTRUCTIONS Keep all follow-up appointments. It often takes many weeks to correct anemia, and having your health care provider check on your condition and your response to treatment is very important. SEEK IMMEDIATE MEDICAL CARE IF:   You develop extreme weakness, shortness of breath, or chest pain.   You become dizzy or have trouble concentrating.  You develop heavy vaginal bleeding.   You develop a rash.   You have bloody or black, tarry stools.   You faint.   You vomit up blood.   You vomit repeatedly.   You have abdominal pain.  You have a fever or persistent symptoms for more than 2-3 days.   You have a fever and your symptoms suddenly get worse.   You are dehydrated.  MAKE  SURE YOU:  Understand these instructions.  Will watch your condition.  Will get help right away if you are not doing well or get worse. Document Released: 06/24/2004 Document Revised: 01/17/2013 Document Reviewed: 11/10/2012 [facility] Patient Information 2015 [place], [place]. This information is not intended to replace advice given to you by your health care provider. Make sure you discuss any questions you have with your health care provider.      I personally performed the services described in this documentation, which was scribed in my presence. The recorded information has been reviewed and considered, and addended by me as needed.

## 2014-05-01 NOTE — Patient Instructions (Addendum)
You should receive a call or letter about your lab results within the next week to 10 days.  If liver tests not improved, may need ultrasound of abdomen. Keep taking the omeprazole once per day. Return to the clinic or go to the nearest emergency room if any of your symptoms worsen or new symptoms occur. Exercise as discussed, and obtain appointment with dentist and optometry or ophthalmologist as recommended for screening exam.   Your blood count was still low (anemia). We will check blood test for iron deficiency, but we need to repeat this in next week to make sure it is not lower.  If you are lightheaded, dizzy, or feel weak before that time - return here or emergency room. Start ferrous sulfate 325mg  (over the counter iron supplement) once per day for now.  Based on your pelvic exam, we may need to check a pelvic ultrasound to see if you have fibroids, that can cause heavy bleeding and anemia.   Keeping You Healthy  Get These Tests  Blood Pressure- Have your blood pressure checked once a year by your health care provider.  Normal blood pressure is 120/80.  Weight- Have your body mass index (BMI) calculated to screen for obesity.  BMI is measure of body fat based on height and weight.  You can also calculate your own BMI at GravelBags.it.  Cholesterol- Have your cholesterol checked every 5 years starting at age 43 then yearly starting at age 68.  Chlamydia, HIV, and other sexually transmitted diseases- Get screened every year until age 67, then within three months of each new sexual provider.  Pap Smear- Every 1-3 years; discuss with your health care provider.  Mammogram- Every year starting at age 33  Take these medicines  Calcium with Vitamin D-Your body needs 1200 mg of Calcium each day and [phone] IU of Vitamin D daily.  Your body can only absorb 500 mg of Calcium at a time so Calcium must be taken in 2 or 3 divided doses throughout the day.  Multivitamin with folic  acid- Once daily if it is possible for you to become pregnant.  Get these Immunizations  Gardasil-Series of three doses; prevents HPV related illness such as genital warts and cervical cancer.  Menactra-Single dose; prevents meningitis.  Tetanus shot- Every 10 years.  Flu shot-Every year.  Take these steps 1. Do not smoke-Your healthcare provider can help you quit.  For tips on how to quit go to www.smokefree.gov or call 1-800 QUITNOW. 2. Be physically active- Exercise 5 days a week for at least 30 minutes.  If you are not already physically active, start slow and gradually work up to 30 minutes of moderate physical activity.  Examples of moderate activity include walking briskly, dancing, swimming, bicycling, etc. 3. Breast Cancer- A self breast exam every month is important for early detection of breast cancer.  For more information and instruction on self breast exams, ask your healthcare provider or https://www.patel.info/. 4. Eat a healthy diet- Eat a variety of healthy foods such as fruits, vegetables, whole grains, low fat milk, low fat cheeses, yogurt, lean meats, poultry and fish, beans, nuts, tofu, etc.  For more information go to www. Thenutritionsource.org 5. Drink alcohol in moderation- Limit alcohol intake to one drink or less per day. Never drink and drive. 6. Depression- Your emotional health is as important as your physical health.  If you're feeling down or losing interest in things you normally enjoy please talk to your healthcare provider about being screened  for depression. 7. Dental visit- Brush and floss your teeth twice daily; visit your dentist twice a year. 8. Eye doctor- Get an eye exam at least every 2 years. 9. Helmet use- Always wear a helmet when riding a bicycle, motorcycle, rollerblading or skateboarding. 13. Safe sex- If you may be exposed to sexually transmitted infections, use a condom. 11. Seat belts- Seat belts can save your live;  always wear one. 12. Smoke/Carbon Monoxide detectors- These detectors need to be installed on the appropriate level of your home. Replace batteries at least once a year. 13. Skin cancer- When out in the sun please cover up and use sunscreen 15 SPF or higher. 14. Violence- If anyone is threatening or hurting you, please tell your healthcare provider.  Anemia, Nonspecific Anemia is a condition in which the concentration of red blood cells or hemoglobin in the blood is below normal. Hemoglobin is a substance in red blood cells that carries oxygen to the tissues of the body. Anemia results in not enough oxygen reaching these tissues.  CAUSES  Common causes of anemia include:   Excessive bleeding. Bleeding may be internal or external. This includes excessive bleeding from periods (in women) or from the intestine.   Poor nutrition.   Chronic kidney, thyroid, and liver disease.  Bone marrow disorders that decrease red blood cell production.  Cancer and treatments for cancer.  HIV, AIDS, and their treatments.  Spleen problems that increase red blood cell destruction.  Blood disorders.  Excess destruction of red blood cells due to infection, medicines, and autoimmune disorders. SIGNS AND SYMPTOMS   Minor weakness.   Dizziness.   Headache.  Palpitations.   Shortness of breath, especially with exercise.   Paleness.  Cold sensitivity.  Indigestion.  Nausea.  Difficulty sleeping.  Difficulty concentrating. Symptoms may occur suddenly or they may develop slowly.  DIAGNOSIS  Additional blood tests are often needed. These help your health care provider determine the best treatment. Your health care provider will check your stool for blood and look for other causes of blood loss.  TREATMENT  Treatment varies depending on the cause of the anemia. Treatment can include:   Supplements of iron, vitamin Z00, or folic acid.   Hormone medicines.   A blood transfusion.  This may be needed if blood loss is severe.   Hospitalization. This may be needed if there is significant continual blood loss.   Dietary changes.  Spleen removal. HOME CARE INSTRUCTIONS Keep all follow-up appointments. It often takes many weeks to correct anemia, and having your health care provider check on your condition and your response to treatment is very important. SEEK IMMEDIATE MEDICAL CARE IF:   You develop extreme weakness, shortness of breath, or chest pain.   You become dizzy or have trouble concentrating.  You develop heavy vaginal bleeding.   You develop a rash.   You have bloody or black, tarry stools.   You faint.   You vomit up blood.   You vomit repeatedly.   You have abdominal pain.  You have a fever or persistent symptoms for more than 2-3 days.   You have a fever and your symptoms suddenly get worse.   You are dehydrated.  MAKE SURE YOU:  Understand these instructions.  Will watch your condition.  Will get help right away if you are not doing well or get worse. Document Released: 06/24/2004 Document Revised: 01/17/2013 Document Reviewed: 11/10/2012 [facility] Patient Information 2015 [place], [place]. This information is not intended to replace advice  to you by your health care provider. Make sure you discuss any questions you have with your health care provider.  

## 2014-05-02 LAB — IRON: IRON: 17 ug/dL — AB (ref 42–145)

## 2014-05-02 LAB — IBC PANEL
%SAT: 4 % — AB (ref 20–55)
TIBC: 430 ug/dL (ref 250–470)
UIBC: 413 ug/dL — ABNORMAL HIGH (ref 125–400)

## 2014-05-02 LAB — COMPREHENSIVE METABOLIC PANEL
ALT: 285 U/L — ABNORMAL HIGH (ref 0–35)
AST: 196 U/L — ABNORMAL HIGH (ref 0–37)
Albumin: 3.9 g/dL (ref 3.5–5.2)
Alkaline Phosphatase: 166 U/L — ABNORMAL HIGH (ref 39–117)
BILIRUBIN TOTAL: 0.5 mg/dL (ref 0.2–1.2)
BUN: 9 mg/dL (ref 6–23)
CO2: 24 meq/L (ref 19–32)
CREATININE: 0.51 mg/dL (ref 0.50–1.10)
Calcium: 8.8 mg/dL (ref 8.4–10.5)
Chloride: 106 mEq/L (ref 96–112)
GLUCOSE: 84 mg/dL (ref 70–99)
Potassium: 4.3 mEq/L (ref 3.5–5.3)
Sodium: 139 mEq/L (ref 135–145)
Total Protein: 7 g/dL (ref 6.0–8.3)

## 2014-05-02 LAB — HEPATITIS PANEL, ACUTE
HCV AB: NEGATIVE
HEP B C IGM: NONREACTIVE
HEP B S AG: NEGATIVE
Hep A IgM: NONREACTIVE

## 2014-05-02 LAB — LIPID PANEL
CHOLESTEROL: 168 mg/dL (ref 0–200)
HDL: 74 mg/dL (ref >39)
LDL Cholesterol: 79 mg/dL (ref 0–99)
TRIGLYCERIDES: 73 mg/dL (ref <150)
Total CHOL/HDL Ratio: 2.3 Ratio
VLDL: 15 mg/dL (ref 0–40)

## 2014-05-02 LAB — HIV ANTIBODY (ROUTINE TESTING W REFLEX): HIV: NONREACTIVE

## 2014-05-06 LAB — PAP IG W/ RFLX HPV ASCU

## 2014-05-08 ENCOUNTER — Ambulatory Visit (INDEPENDENT_AMBULATORY_CARE_PROVIDER_SITE_OTHER): Payer: BC Managed Care – PPO | Admitting: Family Medicine

## 2014-05-08 VITALS — BP 115/78 | HR 80 | Temp 98.0°F | Resp 18 | Wt 124.0 lb

## 2014-05-08 DIAGNOSIS — K219 Gastro-esophageal reflux disease without esophagitis: Secondary | ICD-10-CM

## 2014-05-08 DIAGNOSIS — R748 Abnormal levels of other serum enzymes: Secondary | ICD-10-CM

## 2014-05-08 DIAGNOSIS — D509 Iron deficiency anemia, unspecified: Secondary | ICD-10-CM

## 2014-05-08 NOTE — Patient Instructions (Signed)
Take iron supplements over the counte 1 pill daily with a prenatal vitamin and return in 2 months for anemia recheck You have been scheduled for a ultrasound, if you do not hear from [place] in 405 days please call out office.   Iron Deficiency Anemia Anemia is a condition in which there are less red blood cells or hemoglobin in the blood than normal. Hemoglobin is the part of red blood cells that carries oxygen. Iron deficiency anemia is anemia caused by too little iron. It is the most common type of anemia. It may leave you tired and short of breath. CAUSES   Lack of iron in the diet.  Poor absorption of iron, as seen with intestinal disorders.  Intestinal bleeding.  Heavy periods. SIGNS AND SYMPTOMS  Mild anemia may not be noticeable. Symptoms may include:  Fatigue.  Headache.  Pale skin.  Weakness.  Tiredness.  Shortness of breath.  Dizziness.  Cold hands and feet.  Fast or irregular heartbeat. DIAGNOSIS  Diagnosis requires a thorough evaluation and physical exam by your health care provider. Blood tests are generally used to confirm iron deficiency anemia. Additional tests may be done to find the underlying cause of your anemia. These may include:  Testing for blood in the stool (fecal occult blood test).  A procedure to see inside the colon and rectum (colonoscopy).  A procedure to see inside the esophagus and stomach (endoscopy). TREATMENT  Iron deficiency anemia is treated by correcting the cause of the deficiency. Treatment may involve:  Adding iron-rich foods to your diet.  Taking iron supplements. Pregnant or breastfeeding women need to take extra iron because their normal diet usually does not provide the required amount.  Taking vitamins. Vitamin C improves the absorption of iron. Your health care provider may recommend that you take your iron tablets with a glass of orange juice or vitamin C supplement.  Medicines to make heavy menstrual flow  lighter.  Surgery. HOME CARE INSTRUCTIONS   Take iron as directed by your health care provider.  If you cannot tolerate taking iron supplements by mouth, talk to your health care provider about taking them through a vein (intravenously) or an injection into a muscle.  For the best iron absorption, iron supplements should be taken on an empty stomach. If you cannot tolerate them on an empty stomach, you may need to take them with food.  Do not drink milk or take antacids at the same time as your iron supplements. Milk and antacids may interfere with the absorption of iron.  Iron supplements can cause constipation. Make sure to include fiber in your diet to prevent constipation. A stool softener may also be recommended.  Take vitamins as directed by your health care provider.  Eat a diet rich in iron. Foods high in iron include liver, lean beef, whole-grain bread, eggs, dried fruit, and dark green leafy vegetables. SEEK IMMEDIATE MEDICAL CARE IF:   You faint. If this happens, do not drive. Call your local emergency services (911 in [place] if no other help is available.  You have chest pain.  You feel nauseous or vomit.  You have severe or increased shortness of breath with activity.  You feel weak.  You have a rapid heartbeat.  You have unexplained sweating.  You become light-headed when getting up from a chair or bed. MAKE SURE YOU:   Understand these instructions.  Will watch your condition.  Will get help right away if you are not doing well or get worse.  Document Released: 05/14/2000 Document Revised: 05/22/2013 Document Reviewed: 01/22/2013 [facility] Patient Information 2015 [place], [place]. This information is not intended to replace advice given to you by your health care provider. Make sure you discuss any questions you have with your health care provider.

## 2014-05-08 NOTE — Progress Notes (Addendum)
Chief Complaint:  Chief Complaint  Patient presents with  . Follow-up  . Abdominal Pain    HPI: Heidi Schneider is a 38 y.o. female who is here for  Follow up of GERD sxs She is doing better on PPI omperazole She denies any night swaets, weightloss, fatigue, jaundice She had her liver enzymes rechecked and it was still very eleveated She denies any supplemental drugs She denies tylenol use or alcohol use, no changes in her bowel color or BMs Her cholesterol is normal She deneis nausea, jaundice, vomting, abd pain Hepaitis panel was negative on last OV   Please see not from 05/01/14 below from Dr Carlota Raspberry She is here for follow up. She states she felt well when called 2 days ago, but she decided not to take her Omeprazole that day and her pain returned the day after. She locates it to her upper abdomen. She states she took Omeprazole yesterday and today and her pain resolved today. She denies nausea, vomiting, fever, diarrhea, change in skin color, change in eye color. She denies history of liver problems. She states she traveled to [place] a few months ago. She denies having any illness while there and states she had a hepatitis A vaccine before and after her trip, in March and September. She denies prior anemia or low blood count. She denies lightheadedness or dizziness. Menses regular, 4-5 days, no recent change in amount of bleeding or heavy bleeding.   She also requests physical exam. She states she has not had a physical in a couple years. She states she feels completely normal today. She states she has not had a mammogram. She denies FHx of breast cancer. She states she checks for lumps in her breast at home infrequently and has never noticed anything abnormal. After discussion, she declines mammogram at this time, prefers to wait a few years. She states her last pap smear was few years ago and normal then. she agrees to have one here. She denies abnormal vaginal bleeding. She states  she is still having periods once a month, last on 11/12. She state she has 2 children and has been pregnant twice; her children at 33 and 88. She states this are going well at home. She states she is married and denies extramarital partners. She denies significant family history of medical problems. 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  Immunizations: No flu shot this year, counseled her on the benefit of one and she states she sill consider it. TDAP in March of this year, hepatitis A as above. Dental: Does not have a regular dentist, states she has not seen one in a while. Vision: Does not wear glasses or contacts, has not had an eye exam with an eye doctor in the past few years. Exercise: Does not exercise, advised her to exercise at least 150 minutes a week and gave suggestions on how to do that.  Past Medical History  Diagnosis Date  . GERD (gastroesophageal reflux disease)    History reviewed. No pertinent past surgical history. History   Social History  . Marital Status: Married    Spouse Name: N/A    Number of Children: N/A  . Years of Education: N/A   Social History Main Topics  . Smoking status: Never Smoker   . Smokeless tobacco: None  . Alcohol Use: No  . Drug Use: No  . Sexual Activity: Yes    Birth Control/ Protection:  None   Other Topics Concern  . None   Social History Narrative   History reviewed. No pertinent family history. No Known Allergies Prior to Admission medications   Medication Sig Start Date End Date Taking? Authorizing Provider  omeprazole (PRILOSEC) 20 MG capsule Take 1 capsule (20 mg total) by mouth daily. 04/26/14  Yes Wendie Agreste, MD     ROS: The patient denies fevers, chills, night sweats, unintentional weight loss, chest pain, palpitations, wheezing, dyspnea on exertion, nausea, vomiting, dysuria, hematuria, melena, numbness, weakness, or tingling.  All other systems have been reviewed and were  otherwise negative with the exception of those mentioned in the HPI and as above.    PHYSICAL EXAM: Filed Vitals:   05/08/14 1815  BP: 115/78  Pulse: 80  Temp: 98 F (36.7 C)  Resp: 18   Filed Vitals:   05/08/14 1815  Weight: 124 lb (56.246 kg)   Body mass index is 22.88 kg/(m^2).  General: Alert, no acute distress HEENT:  Normocephalic, atraumatic, oropharynx patent. EOMI, PERRLA Cardiovascular:  Regular rate and rhythm, no rubs murmurs or gallops.  Rdial pulse intact. No pedal edema.  Respiratory: Clear to auscultation bilaterally.  No wheezes, rales, or rhonchi.  No cyanosis, no use of accessory musculature GI: No organomegaly, abdomen is soft and non-tender, positive bowel sounds.  No masses. Skin: No rashes. Neurologic: Facial musculature symmetric. Psychiatric: Patient is appropriate throughout our interaction. Lymphatic: No cervical lymphadenopathy Musculoskeletal: Gait intact.   LABS: Results for orders placed or performed in visit on 05/01/14  Lipid panel  Result Value Ref Range   Cholesterol 168 0 - 200 mg/dL   Triglycerides 73 <150 mg/dL   HDL 74 >39 mg/dL   Total CHOL/HDL Ratio 2.3 Ratio   VLDL 15 0 - 40 mg/dL   LDL Cholesterol 79 0 - 99 mg/dL  Comprehensive metabolic panel  Result Value Ref Range   Sodium 139 135 - 145 mEq/L   Potassium 4.3 3.5 - 5.3 mEq/L   Chloride 106 96 - 112 mEq/L   CO2 24 19 - 32 mEq/L   Glucose, Bld 84 70 - 99 mg/dL   BUN 9 6 - 23 mg/dL   Creat 0.51 0.50 - 1.10 mg/dL   Total Bilirubin 0.5 0.2 - 1.2 mg/dL   Alkaline Phosphatase 166 (H) 39 - 117 U/L   AST 196 (H) 0 - 37 U/L   ALT 285 (H) 0 - 35 U/L   Total Protein 7.0 6.0 - 8.3 g/dL   Albumin 3.9 3.5 - 5.2 g/dL   Calcium 8.8 8.4 - 10.5 mg/dL  Hepatitis panel, acute  Result Value Ref Range   Hepatitis B Surface Ag NEGATIVE NEGATIVE   HCV Ab NEGATIVE NEGATIVE   Hep B C IgM NON REACTIVE NON REACTIVE   Hep A IgM NON REACTIVE NON REACTIVE  HIV antibody  Result Value Ref Range    HIV 1&2 Ab, 4th Generation NONREACTIVE NONREACTIVE  IBC Panel  Result Value Ref Range   UIBC 413 (H) 125 - 400 ug/dL   TIBC 430 250 - 470 ug/dL   %SAT 4 (L) 20 - 55 %  Iron  Result Value Ref Range   Iron 17 (L) 42 - 145 ug/dL  POCT CBC  Result Value Ref Range   WBC 4.2 (A) 4.6 - 10.2 K/uL   Lymph, poc 1.8 0.6 - 3.4   POC LYMPH PERCENT 42.9 10 - 50 %L   MID (cbc) 0.2 0 - 0.9  POC MID % 5.6 0 - 12 %M   POC Granulocyte 2.2 2 - 6.9   Granulocyte percent 51.5 37 - 80 %G   RBC 4.10 4.04 - 5.48 M/uL   Hemoglobin 9.1 (A) 12.2 - 16.2 g/dL   HCT, POC 30.0 (A) 37.7 - 47.9 %   MCV 73.2 (A) 80 - 97 fL   MCH, POC 22.3 (A) 27 - 31.2 pg   MCHC 30.4 (A) 31.8 - 35.4 g/dL   RDW, POC 19.7 %   Platelet Count, POC 328 142 - 424 K/uL   MPV 8.9 0 - 99.8 fL  Pap IG w/ reflex to HPV when ASC-U  Result Value Ref Range   Specimen adequacy: SEE NOTE    FINAL DIAGNOSIS: SEE NOTE    Cytotechnologist: SEE NOTE    QC Cytotechnologist: SEE NOTE      EKG/XRAY:   Primary read interpreted by Dr. Marin Comment at [facility].   ASSESSMENT/PLAN: Encounter Diagnoses  Name Primary?  . Elevated liver enzymes Yes  . Gastroesophageal reflux disease without esophagitis   . Anemia, iron deficiency    She is improving on omeprazoe with GERD sxs No sxs with elevatedl iver enzymes Will get ab [place] She will be referred to GI once we get the results of [place].  F/u prn    Gross sideeffects, risk and benefits, and alternatives of medications d/w patient. Patient is aware that all medications have potential sideeffects and we are unable to predict every sideeffect or drug-drug interaction that may occur.  Idona Stach, [place], DO 05/08/2014 7:06 PM    Spoke to patient about [place] results, she is no symptomatic after taking PPI but has some sxs when she doesn not take it. She ahs gallstone and nonobstructing renal stone.  F/u after conversation with GI   FINDINGS: Gallbladder: Multiple gallstones are identified. No wall  thickening is noted. No pericholecystic fluid is noted.  Common bile duct: Diameter: 8.8 mm. This is greater than that expected for the patient's given age. No intrahepatic biliary ductal dilatation is noted.  Liver: No focal lesion identified. Within normal limits in parenchymal echogenicity.  IVC: No abnormality visualized.  Pancreas: Visualized portion unremarkable.  Spleen: Size and appearance within normal limits.  Right Kidney: Length: 10.6 cm. Echogenicity within normal limits. No mass or hydronephrosis visualized.  Left Kidney: Length: 10.8 cm. A 7 mm echogenic focus is noted in the mid left kidney consistent with nonobstructing stone.  Abdominal aorta: No aneurysm visualized.  Other findings: None.  IMPRESSION: Nonobstructing left renal calculus.

## 2014-05-11 ENCOUNTER — Encounter: Payer: Self-pay | Admitting: Family Medicine

## 2014-05-11 DIAGNOSIS — R7989 Other specified abnormal findings of blood chemistry: Secondary | ICD-10-CM

## 2014-05-11 DIAGNOSIS — R1084 Generalized abdominal pain: Secondary | ICD-10-CM

## 2014-05-11 DIAGNOSIS — R945 Abnormal results of liver function studies: Principal | ICD-10-CM

## 2014-05-11 NOTE — Progress Notes (Signed)
This encounter was created in error - please disregard.

## 2014-05-15 ENCOUNTER — Ambulatory Visit
Admission: RE | Admit: 2014-05-15 | Discharge: 2014-05-15 | Disposition: A | Discharge status: Home / Self Care | Payer: BC Managed Care – PPO | Source: Ambulatory Visit | Attending: Family Medicine | Admitting: Family Medicine

## 2014-05-15 DIAGNOSIS — R748 Abnormal levels of other serum enzymes: Secondary | ICD-10-CM

## 2014-06-13 ENCOUNTER — Other Ambulatory Visit: Payer: Self-pay | Admitting: Family Medicine

## 2014-06-13 DIAGNOSIS — R7989 Other specified abnormal findings of blood chemistry: Secondary | ICD-10-CM

## 2014-06-13 DIAGNOSIS — R945 Abnormal results of liver function studies: Principal | ICD-10-CM

## 2014-06-17 ENCOUNTER — Encounter: Payer: Self-pay | Admitting: Internal Medicine

## 2014-06-19 ENCOUNTER — Telehealth: Payer: Self-pay

## 2014-06-19 NOTE — Telephone Encounter (Signed)
I have rescheduled with the patient for Arta Bruce, PA for 06/26/14 9:45

## 2014-06-19 NOTE — Telephone Encounter (Signed)
-----   Message from Gatha Mayer, MD sent at 06/18/2014  5:13 PM EST ----- Regarding: RE: To scan or not to scan She does not need further imaging as [place] has ruled out obstruction.  Will need additional testing for autoimmune and hereditary problems causing this - I see she has a March appointment  I will get that moved up - probably with one of our APP's and me  Am ccing my RN Barbera Setters to work on this  [employer] - please try to get her in with an APP this week or next  so we can work this up sooner.  thanks ----- Message -----    From: Glenford Bayley, DO    Sent: 06/18/2014   6:03 AM      To: Gatha Mayer, MD Subject: To scan or not to scan                         Hi Glendell Docker,  [facility] your 19 days into the new year is going well !   I have referred you a very pleasant [place] young woman who came to see [place] for a recheck of her elevated enzymes with normal t bili and vague abdominal pain sxs relieved with a PPI. Her repeat liver enzymes continues to be high, acute hep panel, HIV tests were both negative.  CBC shows acute iron def anemia, recent travels. She denies any acute gastreoenteritis, viral illness, or prior infection  with Hep BI ( she is from [place])   US shows multiple gallstones and and nonobstructive renal stone. I don't.   My question is should I get more advance imaging studies on her ie. MRI of abdomen or CT abd pelvis at this point prior to her GI appt.    Thanks, [place]

## 2014-06-26 ENCOUNTER — Ambulatory Visit (INDEPENDENT_AMBULATORY_CARE_PROVIDER_SITE_OTHER): Payer: BLUE CROSS/BLUE SHIELD | Admitting: Physician Assistant

## 2014-06-26 ENCOUNTER — Encounter: Payer: Self-pay | Admitting: Physician Assistant

## 2014-06-26 ENCOUNTER — Other Ambulatory Visit (INDEPENDENT_AMBULATORY_CARE_PROVIDER_SITE_OTHER): Payer: BLUE CROSS/BLUE SHIELD

## 2014-06-26 VITALS — BP 110/68 | HR 98 | Ht 62.0 in | Wt 123.6 lb

## 2014-06-26 DIAGNOSIS — R7989 Other specified abnormal findings of blood chemistry: Secondary | ICD-10-CM

## 2014-06-26 DIAGNOSIS — D508 Other iron deficiency anemias: Secondary | ICD-10-CM

## 2014-06-26 DIAGNOSIS — R945 Abnormal results of liver function studies: Secondary | ICD-10-CM

## 2014-06-26 LAB — COMPREHENSIVE METABOLIC PANEL
ALBUMIN: 4.3 g/dL (ref 3.5–5.2)
ALT: 36 U/L — ABNORMAL HIGH (ref 0–35)
AST: 31 U/L (ref 0–37)
Alkaline Phosphatase: 71 U/L (ref 39–117)
BUN: 9 mg/dL (ref 6–23)
CO2: 27 meq/L (ref 19–32)
Calcium: 9.7 mg/dL (ref 8.4–10.5)
Chloride: 103 mEq/L (ref 96–112)
Creatinine, Ser: 0.55 mg/dL (ref 0.40–1.20)
GFR: 129.8 mL/min (ref >60.00)
Glucose, Bld: 86 mg/dL (ref 70–99)
Potassium: 4.4 mEq/L (ref 3.5–5.1)
SODIUM: 138 meq/L (ref 135–145)
Total Bilirubin: 0.5 mg/dL (ref 0.2–1.2)
Total Protein: 7.9 g/dL (ref 6.0–8.3)

## 2014-06-26 LAB — CBC WITH DIFFERENTIAL/PLATELET
BASOS ABS: 0 10*3/uL (ref 0.0–0.1)
BASOS PCT: 0.6 % (ref 0.0–3.0)
EOS ABS: 0 10*3/uL (ref 0.0–0.7)
Eosinophils Relative: 0.7 % (ref 0.0–5.0)
HCT: 38.3 % (ref 36.0–46.0)
Hemoglobin: 12.9 g/dL (ref 12.0–15.0)
LYMPHS PCT: 33.2 % (ref 12.0–46.0)
Lymphs Abs: 1.8 10*3/uL (ref 0.7–4.0)
MCHC: 33.8 g/dL (ref 30.0–36.0)
MCV: 81.1 fl (ref 78.0–100.0)
MONO ABS: 0.4 10*3/uL (ref 0.1–1.0)
Monocytes Relative: 8.2 % (ref 3.0–12.0)
Neutro Abs: 3 10*3/uL (ref 1.4–7.7)
Neutrophils Relative %: 57.3 % (ref 43.0–77.0)
PLATELETS: 279 10*3/uL (ref 150.0–400.0)
RBC: 4.72 Mil/uL (ref 3.87–5.11)
RDW: 24.4 % — AB (ref 11.5–15.5)
WBC: 5.3 10*3/uL (ref 4.0–10.5)

## 2014-06-26 LAB — URINALYSIS
BILIRUBIN URINE: NEGATIVE
Hgb urine dipstick: NEGATIVE
Ketones, ur: NEGATIVE
LEUKOCYTES UA: NEGATIVE
NITRITE: NEGATIVE
Specific Gravity, Urine: 1.02 (ref 1.000–1.030)
Total Protein, Urine: NEGATIVE
Urine Glucose: NEGATIVE
Urobilinogen, UA: 0.2 (ref 0.0–1.0)
pH: 6.5 (ref 5.0–8.0)

## 2014-06-26 LAB — TSH: TSH: 0.5 u[IU]/mL (ref 0.35–4.50)

## 2014-06-26 LAB — IGA: IgA: 209 mg/dL (ref 68–378)

## 2014-06-26 LAB — PROTIME-INR
INR: 1 ratio (ref 0.8–1.0)
Prothrombin Time: 10.6 s (ref 9.6–13.1)

## 2014-06-26 MED ORDER — PEG-KCL-NACL-NASULF-NA ASC-C 100 G PO SOLR: 1.0000 | Freq: Once | ORAL | Status: DC

## 2014-06-26 NOTE — Patient Instructions (Signed)
Go to the basement for labs today  You have been scheduled for an endoscopy and colonoscopy. Please follow the written instructions given to you at your visit today. Please pick up your prep at the pharmacy within the next 1-3 days. If you use inhalers (even only as needed), please bring them with you on the day of your procedure. Your physician has requested that you go to www.startemmi.com and enter the access code given to you at your visit today. This web site gives a general overview about your procedure. However, you should still follow specific instructions given to you by our office regarding your preparation for the procedure.

## 2014-06-26 NOTE — Progress Notes (Addendum)
Patient ID: Heidi Schneider, female   DOB: 07-14-1975, 39 y.o.   MRN: [id number]    HPI:  [name] is a 39 year old female referred for evaluation by Dr. Marin Comment due to abnormal LFTs and anemia.  [name] is originally from [place], and has lived in [place] since 1993. She was recently seen in urgent care with symptoms of heartburn. She had no nausea, vomiting, abdominal pain, or jaundice. She was given a trial of omeprazole with resolution of her symptoms. At that time her liver enzymes were checked and were noted to be elevated. She has not been on any new medications or over-the-counter supplements or herbal supplements. She does not drink alcohol and denies any use of recreational drugs. She has never had any blood transfusions and has no prior history of hepatitis. She does not have any tattoos. She was sent for an abdominal ultrasound that showed the common bile duct to be 8.8 mm and revealed a gallstone.  While at urgent care, she requested a physical. She had blood work that showed her to be anemic. She has not had any change in her bowel habits or stool caliber. She has had no bright red blood per rectum but states at the time when she was having heartburn she did have some black stools. Her appetite has been good and she's had no weight loss. Her last menstrual period was 3 weeks ago. She states her menses last about 3 days and are not particularly heavy. There is no known family history of colon cancer, colon polyps, or inflammatory bowel disease. There is no family history of celiac disease that she is aware of.   Past Medical History  Diagnosis Date  . GERD (gastroesophageal reflux disease)     History reviewed. No pertinent past surgical history. History reviewed. No pertinent family history. History  Substance Use Topics  . Smoking status: Never Smoker   . Smokeless tobacco: Not on file  . Alcohol Use: No   Current Outpatient Prescriptions  Medication Sig Dispense Refill  .  omeprazole (PRILOSEC) 20 MG capsule Take 1 capsule (20 mg total) by mouth daily. 30 capsule 1  . peg 3350 powder (MOVIPREP) 100 G SOLR Take 1 kit (200 g total) by mouth once. 1 kit 0   No current facility-administered medications for this visit.   No Known Allergies   Review of Systems: Gen: Denies any fever, chills, sweats, anorexia, fatigue, weakness, malaise, weight loss, and sleep disorder CV: Denies chest pain, angina, palpitations, syncope, orthopnea, PND, peripheral edema, and claudication. Resp: Denies dyspnea at rest, dyspnea with exercise, cough, sputum, wheezing, coughing up blood, and pleurisy. GI: Denies vomiting blood, jaundice, and fecal incontinence.   Denies dysphagia or odynophagia. GU : Denies urinary burning, blood in urine, urinary frequency, urinary hesitancy, nocturnal urination, and urinary incontinence. MS: Denies joint pain, limitation of movement, and swelling, stiffness, low back pain, extremity pain. Denies muscle weakness, cramps, atrophy.  Derm: Denies rash, itching, dry skin, hives, moles, warts, or unhealing ulcers.  Psych: Denies depression, anxiety, memory loss, suicidal ideation, hallucinations, paranoia, and confusion. Heme: Denies bruising, bleeding, and enlarged lymph nodes. Neuro:  Denies any headaches, dizziness, paresthesias. Endo:  Denies any problems with DM, thyroid, adrenal function  Studies: Complete   Status: Final result       PACS Images     Show images for US Abdomen Complete     Study Result     CLINICAL DATA: Elevated LFTs  EXAM: ULTRASOUND ABDOMEN  COMPLETE  COMPARISON: None.  FINDINGS: Gallbladder: Multiple gallstones are identified. No wall thickening is noted. No pericholecystic fluid is noted.  Common bile duct: Diameter: 8.8 mm. This is greater than that expected for the patient's given age. No intrahepatic biliary ductal dilatation is noted.  Liver: No focal lesion identified. Within normal limits  in parenchymal echogenicity.  IVC: No abnormality visualized.  Pancreas: Visualized portion unremarkable.  Spleen: Size and appearance within normal limits.  Right Kidney: Length: 10.6 cm. Echogenicity within normal limits. No mass or hydronephrosis visualized.  Left Kidney: Length: 10.8 cm. A 7 mm echogenic focus is noted in the mid left kidney consistent with nonobstructing stone.  Abdominal aorta: No aneurysm visualized.  Other findings: None.  IMPRESSION: Nonobstructing left renal calculus.   Electronically Signed  By: Inez Catalina M.D.  On: 05/15/2014 09:20     LAB RESULTS: Blood work on 12-15 showed a total bili 0.5 alkaline phosphatase 166 AST 196 ALT 285 Hepatitis B surface antigen negative HCV antibody negative Hepatitis B core IgM nonreactive hepatitis A IgM nonreactive HIV nonreactive CBC White blood cell count 4.2, hemoglobin 9.1, hematocrit 30, platelet count 328,000. TIBC 4:30. Iron 17. Percent saturation 4.  Physical Exam: BP 110/68 mmHg  Pulse 98  Ht _0  (1.575 m)  Wt 123 lb 9.6 oz (56.065 kg)  BMI 22.60 kg/m2  SpO2 99% Constitutional: Pleasant,well-developed *female in no acute distress. HEENT: Normocephalic and atraumatic. Conjunctivae are normal. No scleral icterus. Neck supple. No thyromegaly Cardiovascular: Normal rate, regular rhythm.  Pulmonary/chest: Effort normal and breath sounds normal. No wheezing, rales or rhonchi. Abdominal: Soft, nondistended, nontender. Bowel sounds active throughout. There are no masses palpable. No hepatomegaly. Rectal: Brown stool, heme negative Extremities: no edema Lymphadenopathy: No cervical adenopathy noted. Neurological: Alert and oriented to person place and time. Skin: Skin is warm and dry. No rashes noted. Psychiatric: Normal mood and affect. Behavior is normal.  ASSESSMENT AND PLAN:  #1. Abnormal LFTs. She may have passed a stone. Repeat LFTs, GGT, LDH, hepatitis B, celiac panel,  alpha-1 antitrypsin, comprehensive metabolic panel, PT/INR, urinalysis, AMA, anti-smooth muscle antibody, ceruloplasmin, hep B surface antibody, will be obtained. If these are nonrevealing, she may be a candidate for an MRCP.  #2. Iron deficiency anemia. She will be scheduled for colonoscopy to evaluate for polyps, neoplasia, or possible inflammatory process, AVMs etc.The risks, benefits, and alternatives to colonoscopy with possible biopsy and possible polypectomy were discussed with the patient and they consent to proceed. She will also be scheduled for an EGD to assess for gastritis, esophagitis, ulcers, and to possibly obtain biopsies to evaluate for celiac.The risks, benefits, and alternatives to endoscopy with possible biopsy and possible dilation were discussed with the patient and they consent to proceed. The procedures will be scheduled with Dr. Hilarie Fredrickson. Other recommendations will be made pending the findings of the above testing.   Sinclaire Artiga, Deloris Ping 06/26/2014, 4:37 PM   Addendum: Reviewed and agree with initial management. If LFTs remain elevated would pursue MRCP given stones in gallbladder by [place] and relatively large CBD for age. Jerene Bears, MD

## 2014-06-27 LAB — TISSUE TRANSGLUTAMINASE, IGA: Tissue Transglutaminase Ab, IgA: 1 U/mL (ref <4)

## 2014-06-27 LAB — HEPATITIS B CORE ANTIBODY, TOTAL: Hep B Core Total Ab: REACTIVE — AB

## 2014-06-27 LAB — HEPATITIS B SURFACE ANTIBODY,QUALITATIVE: Hep B S Ab: POSITIVE — AB

## 2014-06-27 LAB — MITOCHONDRIAL ANTIBODIES: Mitochondrial M2 Ab, IgG: 0.43 (ref <0.91)

## 2014-06-27 LAB — AFP TUMOR MARKER: AFP-Tumor Marker: 5.6 ng/mL (ref <6.1)

## 2014-06-27 LAB — ANA: Anti Nuclear Antibody(ANA): NEGATIVE

## 2014-06-28 LAB — CERULOPLASMIN: CERULOPLASMIN: 22 mg/dL (ref 18–53)

## 2014-06-28 LAB — ANTI-SMOOTH MUSCLE ANTIBODY, IGG: Smooth Muscle Ab: 19 U (ref <20)

## 2014-06-28 LAB — ALPHA-1-ANTITRYPSIN: A-1 Antitrypsin, Ser: 129 mg/dL (ref 83–199)

## 2014-06-30 LAB — HEPATITIS E ANTIBODY, IGM

## 2014-06-30 LAB — HEPATITIS E ANTIBODY, IGG

## 2014-07-01 ENCOUNTER — Other Ambulatory Visit: Payer: Self-pay

## 2014-07-01 DIAGNOSIS — D509 Iron deficiency anemia, unspecified: Secondary | ICD-10-CM

## 2014-07-23 ENCOUNTER — Ambulatory Visit (INDEPENDENT_AMBULATORY_CARE_PROVIDER_SITE_OTHER): Payer: BLUE CROSS/BLUE SHIELD | Admitting: Physician Assistant

## 2014-07-23 ENCOUNTER — Other Ambulatory Visit (INDEPENDENT_AMBULATORY_CARE_PROVIDER_SITE_OTHER): Payer: BLUE CROSS/BLUE SHIELD

## 2014-07-23 ENCOUNTER — Encounter: Payer: Self-pay | Admitting: Physician Assistant

## 2014-07-23 VITALS — BP 106/60 | HR 64 | Ht 62.0 in | Wt 125.6 lb

## 2014-07-23 DIAGNOSIS — D509 Iron deficiency anemia, unspecified: Secondary | ICD-10-CM

## 2014-07-23 DIAGNOSIS — K2100 Gastro-esophageal reflux disease with esophagitis, without bleeding: Secondary | ICD-10-CM

## 2014-07-23 DIAGNOSIS — B172 Acute hepatitis E: Secondary | ICD-10-CM

## 2014-07-23 LAB — COMPREHENSIVE METABOLIC PANEL
ALBUMIN: 4 g/dL (ref 3.5–5.2)
ALK PHOS: 70 U/L (ref 39–117)
ALT: 20 U/L (ref 0–35)
AST: 22 U/L (ref 0–37)
BUN: 9 mg/dL (ref 6–23)
CO2: 31 mEq/L (ref 19–32)
Calcium: 9.1 mg/dL (ref 8.4–10.5)
Chloride: 104 mEq/L (ref 96–112)
Creatinine, Ser: 0.67 mg/dL (ref 0.40–1.20)
GFR: 103.33 mL/min (ref >60.00)
GLUCOSE: 85 mg/dL (ref 70–99)
Potassium: 4.1 mEq/L (ref 3.5–5.1)
Sodium: 137 mEq/L (ref 135–145)
Total Bilirubin: 0.3 mg/dL (ref 0.2–1.2)
Total Protein: 7.2 g/dL (ref 6.0–8.3)

## 2014-07-23 LAB — CBC WITH DIFFERENTIAL/PLATELET
BASOS PCT: 0.4 % (ref 0.0–3.0)
Basophils Absolute: 0 10*3/uL (ref 0.0–0.1)
EOS ABS: 0.1 10*3/uL (ref 0.0–0.7)
Eosinophils Relative: 1 % (ref 0.0–5.0)
HCT: 37.3 % (ref 36.0–46.0)
Hemoglobin: 12.5 g/dL (ref 12.0–15.0)
Lymphocytes Relative: 30.3 % (ref 12.0–46.0)
Lymphs Abs: 1.6 10*3/uL (ref 0.7–4.0)
MCHC: 33.5 g/dL (ref 30.0–36.0)
MCV: 85.1 fl (ref 78.0–100.0)
MONO ABS: 0.4 10*3/uL (ref 0.1–1.0)
Monocytes Relative: 7.9 % (ref 3.0–12.0)
NEUTROS PCT: 60.4 % (ref 43.0–77.0)
Neutro Abs: 3.2 10*3/uL (ref 1.4–7.7)
PLATELETS: 248 10*3/uL (ref 150.0–400.0)
RBC: 4.38 Mil/uL (ref 3.87–5.11)
RDW: 19 % — ABNORMAL HIGH (ref 11.5–15.5)
WBC: 5.3 10*3/uL (ref 4.0–10.5)

## 2014-07-23 NOTE — Progress Notes (Addendum)
Patient ID: Heidi Schneider, female   DOB: [birth year], 39 y.o.   MRN: [id number]     History of Present Illness:   This is a follow-up for this delightful 39 year old female who was seen in January for evaluation of abnormal LFTs and anemia. Prior to being seen in this office, she had been evaluated in urgent care with symptoms of heartburn. She had had no nausea, vomiting, abdominal pain, or jaundice she was given a trial of omeprazole with resolution of her symptoms. At that time her liver enzymes were checked and were noted to be elevated. While it urgent care she also had blood work that found her to be anemic. She had an abdominal ultrasound that showed multiple gallstones in the gallbladder but no wall thickening was noted and no. Cholecystic fluid was noted. It was thought she may have passed a stone resulting in her abnormal LFTs. Repeat LFTs, GGT, LDH, hepatitis B, celiac panel, alpha-1 antitrypsin, comprehensive metabolic panel, PT/INR, urinalysis, AMA, anti-smooth muscle antibody, ceruloplasmin, hepatitis B surface antibody, hepatitis C antibody, hepatitis B antibody, and hepatitis E blood work were obtained. She was found to be positive for hepatitis B. Repeat blood work also found her to no longer be anemic. She was asked to complete stool cards. She dropped her stool cards in the mail 3 days ago and unfortunately they have not yet been developed.  She states she has been feeling fine. She has had no further abdominal pain, nausea, or vomiting. She has used Prilosec intermittently but states she only gets heartburn if she eats something very spicy which is very rare for her.   Past Medical History  Diagnosis Date  . GERD (gastroesophageal reflux disease)     History reviewed. No pertinent past surgical history. History reviewed. No pertinent family history. History  Substance Use Topics  . Smoking status: Never Smoker   . Smokeless tobacco: Never Used  . Alcohol Use: No   Current  Outpatient Prescriptions  Medication Sig Dispense Refill  . omeprazole (PRILOSEC) 20 MG capsule Take 1 capsule (20 mg total) by mouth daily. 30 capsule 1   No current facility-administered medications for this visit.   No Known Allergies    Review of Systems: Gen: Denies any fever, chills, sweats, anorexia, fatigue, weakness, malaise, weight loss, and sleep disorder CV: Denies chest pain, angina, palpitations, syncope, orthopnea, PND, peripheral edema, and claudication. Resp: Denies dyspnea at rest, dyspnea with exercise, cough, sputum, wheezing, coughing up blood, and pleurisy. GI: Denies vomiting blood, jaundice, and fecal incontinence.   Denies dysphagia or odynophagia. GU : Denies urinary burning, blood in urine, urinary frequency, urinary hesitancy, nocturnal urination, and urinary incontinence. MS: Denies joint pain, limitation of movement, and swelling, stiffness, low back pain, extremity pain. Denies muscle weakness, cramps, atrophy.  Derm: Denies rash, itching, dry skin, hives, moles, warts, or unhealing ulcers.  Psych: Denies depression, anxiety, memory loss, suicidal ideation, hallucinations, paranoia, and confusion. Heme: Denies bruising, bleeding, and enlarged lymph nodes. Neuro:  Denies any headaches, dizziness, paresthesia Endo:  Denies any problems with DM, thyroid, adrenal  LAB RESULTS: Blood work on June 26, 2014 showed hepatitis E antibody IgM positive IgG nonreactive. Hepatitis B core total antibody reactive Hepatitis B surface antibody positive Ceruloplasmin 22  anti-smooth muscle antibody 19 ANA negative Mitochondrial antibody IgG 0.43 Alpha-1 antitrypsin 129 AFP 5.6 INR 1 Prothrombin time 10.6 TTG  AB IgA 1 CBC had white blood cell 5.3, hemoglobin 12.9, hematocrit 38.3, platelets 279,000. MCV 81.1. Studies:  Complete  Status: Final result       PACS Images     Show images for US Abdomen Complete     Study Result     CLINICAL DATA:  Elevated LFTs  EXAM: ULTRASOUND ABDOMEN COMPLETE  COMPARISON: None.  FINDINGS: Gallbladder: Multiple gallstones are identified. No wall thickening is noted. No pericholecystic fluid is noted.  Common bile duct: Diameter: 8.8 mm. This is greater than that expected for the patient's given age. No intrahepatic biliary ductal dilatation is noted.  Liver: No focal lesion identified. Within normal limits in parenchymal echogenicity.  IVC: No abnormality visualized.  Pancreas: Visualized portion unremarkable.  Spleen: Size and appearance within normal limits.  Right Kidney: Length: 10.6 cm. Echogenicity within normal limits. No mass or hydronephrosis visualized.  Left Kidney: Length: 10.8 cm. A 7 mm echogenic focus is noted in the mid left kidney consistent with nonobstructing stone.  Abdominal aorta: No aneurysm visualized.  Other findings: None.  IMPRESSION: Nonobstructing left renal calculus.   Electronically Signed  By: Inez Catalina M.D.  On: 05/15/2014 09:20       Physical Exam: General: Pleasant, well developed female in no acute distress Head: Normocephalic and atraumatic Eyes:  sclerae anicteric, conjunctiva pink  Ears: Normal auditory acuity Lungs: Clear throughout to auscultation Heart: Regular rate and rhythm Abdomen: Soft, non distended, non-tender. No masses, no hepatomegaly. Normal bowel sounds Musculoskeletal: Symmetrical with no gross deformities  Extremities: No edema  Neurological: Alert oriented x 4, grossly nonfocal Psychological:  Alert and cooperative. Normal mood and affect  Assessment and Recommendations: #1. Abnormal LFTs. These are likely due to hepatitis E. The patient had been in [place] 2-3 months prior to her office visit here. It was explained to the patient that hepatitis E is typically self limiting but can cause problems during pregnancy. We will repeat her LFTs today. She will follow up in 3 months.  #2.  History of iron deficiency anemia. Repeat CBC found the patient to not be anemic. She states her menses have been normal and not heavy. She has had no bright red blood per rectum or melena and has no upper GI symptoms. Her stool cards are pending and she will repeat a CBC today.  Anni Hocevar, Vita Barley PA-C 07/23/2014,   Addendum: Reviewed and agree with management. Await FOBT testing and follow-up LFTs from today to ensure normalization after acute Hep E  Jerene Bears, MD

## 2014-07-23 NOTE — Patient Instructions (Signed)
Food Choices for Gastroesophageal Reflux Disease When you have gastroesophageal reflux disease (GERD), the foods you eat and your eating habits are very important. Choosing the right foods can help ease the discomfort of GERD. WHAT GENERAL GUIDELINES DO I NEED TO FOLLOW?  Choose fruits, vegetables, whole grains, low-fat dairy products, and low-fat meat, fish, and poultry.  Limit fats such as oils, salad dressings, butter, nuts, and avocado.  Keep a food diary to identify foods that cause symptoms.  Avoid foods that cause reflux. These may be different for different people.  Eat frequent small meals instead of three large meals each day.  Eat your meals slowly, in a relaxed setting.  Limit fried foods.  Cook foods using methods other than frying.  Avoid drinking alcohol.  Avoid drinking large amounts of liquids with your meals.  Avoid bending over or lying down until 2-3 hours after eating. WHAT FOODS ARE NOT RECOMMENDED? The following are some foods and drinks that may worsen your symptoms: Vegetables Tomatoes. Tomato juice. Tomato and spaghetti sauce. Chili peppers. Onion and garlic. Horseradish. Fruits Oranges, grapefruit, and lemon (fruit and juice). Meats High-fat meats, fish, and poultry. This includes hot dogs, ribs, ham, sausage, salami, and bacon. Dairy Whole milk and chocolate milk. Sour cream. Cream. Butter. Ice cream. Cream cheese.  Beverages Coffee and tea, with or without caffeine. Carbonated beverages or energy drinks. Condiments Hot sauce. Barbecue sauce.  Sweets/Desserts Chocolate and cocoa. Donuts. Peppermint and spearmint. Fats and Oils High-fat foods, including [place] fries and potato chips. Other Vinegar. Strong spices, such as black pepper, white pepper, red pepper, cayenne, curry powder, cloves, ginger, and chili powder. The items listed above may not be a complete list of foods and beverages to avoid. Contact your dietitian for more  information. Document Released: 05/17/2005 Document Revised: 05/22/2013 Document Reviewed: 03/21/2013 [facility] Patient Information 2015 [place], [place]. This information is not intended to replace advice given to you by your health care provider. Make sure you discuss any questions you have with your health care provider.  Please continue taking omeprazole daily  Your physician has requested that you go to the basement for the following lab work before leaving today: CBC, CMP  Please follow up with Dr. Hilarie Fredrickson in 3 months

## 2014-07-26 ENCOUNTER — Other Ambulatory Visit (INDEPENDENT_AMBULATORY_CARE_PROVIDER_SITE_OTHER): Payer: BLUE CROSS/BLUE SHIELD

## 2014-07-26 DIAGNOSIS — D509 Iron deficiency anemia, unspecified: Secondary | ICD-10-CM

## 2014-07-26 LAB — HEMOCCULT SLIDES (X 3 CARDS)
Fecal Occult Blood: NEGATIVE
OCCULT 1: NEGATIVE
OCCULT 2: NEGATIVE
OCCULT 3: NEGATIVE
OCCULT 4: NEGATIVE
OCCULT 5: NEGATIVE

## 2014-07-31 ENCOUNTER — Encounter: Payer: BLUE CROSS/BLUE SHIELD | Admitting: Internal Medicine

## 2014-08-07 ENCOUNTER — Ambulatory Visit: Payer: Self-pay | Admitting: Internal Medicine

## 2014-08-27 ENCOUNTER — Other Ambulatory Visit: Payer: Self-pay | Admitting: Family Medicine

## 2014-09-02 ENCOUNTER — Telehealth: Payer: Self-pay | Admitting: Physician Assistant

## 2014-09-02 DIAGNOSIS — R1013 Epigastric pain: Secondary | ICD-10-CM

## 2014-09-02 DIAGNOSIS — K219 Gastro-esophageal reflux disease without esophagitis: Secondary | ICD-10-CM

## 2014-09-02 MED ORDER — OMEPRAZOLE 20 MG PO CPDR: 20.0000 mg | DELAYED_RELEASE_CAPSULE | Freq: Every day | ORAL | Status: DC

## 2014-09-02 NOTE — Telephone Encounter (Signed)
Rx sent and left a message for patient that it was sent.

## 2014-09-02 NOTE — Telephone Encounter (Signed)
Can refill for 6 months

## 2014-09-02 NOTE — Telephone Encounter (Signed)
Spoke with patient and told her Dr. Cindee Lame ordered the Omeprazole for her. She states Lori Hvozdovic, PA-C told her she would refill this for her Please, advise.

## 2015-10-14 ENCOUNTER — Telehealth: Payer: Self-pay | Admitting: *Deleted

## 2015-10-14 NOTE — Telephone Encounter (Signed)
Faxed prescription request back to [facility]. Noted on it that the patient needs to call our office for an appointment. She has not been seen since 07-23-2014.  Cancelled two procedures. Did suggest she get over the counter prilosec .

## 2016-02-23 ENCOUNTER — Encounter: Payer: Self-pay | Admitting: *Deleted

## 2016-03-02 ENCOUNTER — Encounter: Payer: Self-pay | Admitting: *Deleted

## 2016-03-17 ENCOUNTER — Other Ambulatory Visit (INDEPENDENT_AMBULATORY_CARE_PROVIDER_SITE_OTHER): Payer: BLUE CROSS/BLUE SHIELD

## 2016-03-17 ENCOUNTER — Encounter: Payer: Self-pay | Admitting: Internal Medicine

## 2016-03-17 ENCOUNTER — Ambulatory Visit (INDEPENDENT_AMBULATORY_CARE_PROVIDER_SITE_OTHER): Payer: BLUE CROSS/BLUE SHIELD | Admitting: Internal Medicine

## 2016-03-17 ENCOUNTER — Encounter (INDEPENDENT_AMBULATORY_CARE_PROVIDER_SITE_OTHER): Payer: Self-pay

## 2016-03-17 VITALS — BP 90/62 | HR 72 | Ht 61.75 in | Wt 122.0 lb

## 2016-03-17 DIAGNOSIS — K219 Gastro-esophageal reflux disease without esophagitis: Secondary | ICD-10-CM

## 2016-03-17 DIAGNOSIS — K8020 Calculus of gallbladder without cholecystitis without obstruction: Secondary | ICD-10-CM

## 2016-03-17 LAB — COMPREHENSIVE METABOLIC PANEL
ALBUMIN: 4.3 g/dL (ref 3.5–5.2)
ALK PHOS: 145 U/L — AB (ref 39–117)
ALT: 53 U/L — AB (ref 0–35)
AST: 18 U/L (ref 0–37)
BUN: 6 mg/dL (ref 6–23)
CALCIUM: 9.3 mg/dL (ref 8.4–10.5)
CO2: 27 mEq/L (ref 19–32)
CREATININE: 0.71 mg/dL (ref 0.40–1.20)
Chloride: 105 mEq/L (ref 96–112)
GFR: 95.86 mL/min (ref >60.00)
Glucose, Bld: 81 mg/dL (ref 70–99)
Potassium: 4.2 mEq/L (ref 3.5–5.1)
Sodium: 139 mEq/L (ref 135–145)
Total Bilirubin: 0.4 mg/dL (ref 0.2–1.2)
Total Protein: 8 g/dL (ref 6.0–8.3)

## 2016-03-17 LAB — PROTIME-INR
INR: 1 ratio (ref 0.8–1.0)
Prothrombin Time: 10.4 s (ref 9.6–13.1)

## 2016-03-17 LAB — CBC WITH DIFFERENTIAL/PLATELET
BASOS ABS: 0 10*3/uL (ref 0.0–0.1)
BASOS PCT: 0.6 % (ref 0.0–3.0)
Eosinophils Absolute: 0 10*3/uL (ref 0.0–0.7)
Eosinophils Relative: 0.7 % (ref 0.0–5.0)
HEMATOCRIT: 28.4 % — AB (ref 36.0–46.0)
HEMOGLOBIN: 9.1 g/dL — AB (ref 12.0–15.0)
LYMPHS PCT: 34.8 % (ref 12.0–46.0)
Lymphs Abs: 2 10*3/uL (ref 0.7–4.0)
MCHC: 32.1 g/dL (ref 30.0–36.0)
MCV: 68.2 fl — ABNORMAL LOW (ref 78.0–100.0)
MONO ABS: 0.5 10*3/uL (ref 0.1–1.0)
Monocytes Relative: 8.4 % (ref 3.0–12.0)
Neutro Abs: 3.3 10*3/uL (ref 1.4–7.7)
Neutrophils Relative %: 55.5 % (ref 43.0–77.0)
Platelets: 347 10*3/uL (ref 150.0–400.0)
RBC: 4.17 Mil/uL (ref 3.87–5.11)
RDW: 19.7 % — ABNORMAL HIGH (ref 11.5–15.5)
WBC: 5.9 10*3/uL (ref 4.0–10.5)

## 2016-03-17 MED ORDER — PANTOPRAZOLE SODIUM 40 MG PO TBEC: 40.0000 mg | DELAYED_RELEASE_TABLET | Freq: Every day | ORAL | 3 refills | Status: DC

## 2016-03-17 NOTE — Progress Notes (Signed)
   Subjective:    Patient ID: Heidi Schneider, female    DOB: 1975/12/16, 40 y.o.   MRN: [id number]  HPI Elloise Thum is a 40 year old [place] female with a past medical history of resolved hepatitis E, GERD who is seen for follow-up. She has been maintained on omeprazole 20 mg daily. She reports she continues to have issues with heartburn and epigastric discomfort. This can come for weeks or months at a time despite PPI therapy. She reports a good appetite without nausea or vomiting. No dysphagia or odynophagia. No weight loss. Prominence been regular without blood in her stool or melena. She denies chest pain and shortness of breath.   Review of Systems As per history of present illness, otherwise negative  Current Medications, Allergies, Past Medical History, Past Surgical History, Family History and Social History were reviewed in [employer] record.     Objective:   Physical Exam BP 90/62   Pulse 72   Ht 5' 1.75" (1.568 m)   Wt 122 lb (55.3 kg)   BMI 22.50 kg/m  Constitutional: Well-developed and well-nourished. No distress. HEENT: Normocephalic and atraumatic. Oropharynx is clear and moist. No oropharyngeal exudate. Conjunctivae are normal.  No scleral icterus. Neck: Neck supple. Trachea midline. Cardiovascular: Normal rate, regular rhythm and intact distal pulses. No M/R/G Pulmonary/chest: Effort normal and breath sounds normal. No wheezing, rales or rhonchi. Abdominal: Soft, nontender, nondistended. Bowel sounds active throughout. There are no masses palpable. No hepatosplenomegaly. Extremities: no clubbing, cyanosis, or edema Lymphadenopathy: No cervical adenopathy noted. Neurological: Alert and oriented to person place and time. Skin: Skin is warm and dry.  Psychiatric: Normal mood and affect. Behavior is normal.      Assessment & Plan:  40 year old [place] female with a past medical history of resolved hepatitis E, GERD who is seen for  follow-up.  1. GERD/indigestion -- ongoing despite PPI. EGD recommended. We discussed the risks, benefits and alternatives and she wishes to proceed. Change omeprazole to pantoprazole 40 mg daily. She is asked to take this 30 minutes before breakfast each day.  2. History of gallstones -- seen by prior ultrasound. Symptoms are not consistent with biliary type pain at present.  3. History of elevated LFTs and hepatitis E -- repeat LFTs today to ensure normalization. Hepatitis E typically is self-limited and should self resolve leading to immunity.  25 minutes spent with the patient today. Greater than 50% was spent in counseling and coordination of care with the patient

## 2016-03-17 NOTE — Patient Instructions (Addendum)
You have been scheduled for an endoscopy. Please follow written instructions given to you at your visit today. If you use inhalers (even only as needed), please bring them with you on the day of your procedure. Your physician has requested that you go to www.startemmi.com and enter the access code given to you at your visit today. This web site gives a general overview about your procedure. However, you should still follow specific instructions given to you by our office regarding your preparation for the procedure.  We have sent the following medications to your pharmacy for you to pick up at your convenience: Pantoprazole  Your physician has requested that you go to the basement for the following lab work before leaving today: CBC, CMP, INR  If you are age 38 or older, your body mass index should be between 23-30. Your Body mass index is 22.5 kg/m. If this is out of the aforementioned range listed, please consider follow up with your Primary Care Provider.  If you are age 57 or younger, your body mass index should be between 19-25. Your Body mass index is 22.5 kg/m. If this is out of the aformentioned range listed, please consider follow up with your Primary Care Provider.

## 2016-03-18 ENCOUNTER — Other Ambulatory Visit: Payer: Self-pay

## 2016-03-18 DIAGNOSIS — D509 Iron deficiency anemia, unspecified: Secondary | ICD-10-CM

## 2016-03-18 DIAGNOSIS — K839 Disease of biliary tract, unspecified: Secondary | ICD-10-CM

## 2016-03-18 MED ORDER — INTEGRA 62.5-62.5-40-3 MG PO CAPS: 62.5000 mg | ORAL_CAPSULE | Freq: Every day | ORAL | 3 refills | Status: DC

## 2016-03-23 ENCOUNTER — Encounter: Payer: Self-pay | Admitting: Internal Medicine

## 2016-03-24 ENCOUNTER — Other Ambulatory Visit: Payer: Self-pay | Admitting: Internal Medicine

## 2016-03-24 ENCOUNTER — Ambulatory Visit (HOSPITAL_COMMUNITY)
Admission: RE | Admit: 2016-03-24 | Discharge: 2016-03-24 | Disposition: A | Discharge status: Home / Self Care | Payer: BLUE CROSS/BLUE SHIELD | Source: Ambulatory Visit | Attending: Internal Medicine | Admitting: Internal Medicine

## 2016-03-24 ENCOUNTER — Encounter: Payer: Self-pay | Admitting: Internal Medicine

## 2016-03-24 ENCOUNTER — Ambulatory Visit: Payer: BLUE CROSS/BLUE SHIELD | Admitting: *Deleted

## 2016-03-24 VITALS — Ht 62.0 in | Wt 123.0 lb

## 2016-03-24 DIAGNOSIS — K839 Disease of biliary tract, unspecified: Secondary | ICD-10-CM

## 2016-03-24 DIAGNOSIS — K8020 Calculus of gallbladder without cholecystitis without obstruction: Secondary | ICD-10-CM | POA: Insufficient documentation

## 2016-03-24 DIAGNOSIS — K838 Other specified diseases of biliary tract: Secondary | ICD-10-CM | POA: Diagnosis present

## 2016-03-24 DIAGNOSIS — K219 Gastro-esophageal reflux disease without esophagitis: Secondary | ICD-10-CM

## 2016-03-24 DIAGNOSIS — R1013 Epigastric pain: Secondary | ICD-10-CM

## 2016-03-24 MED ORDER — GADOBENATE DIMEGLUMINE 529 MG/ML IV SOLN
15.0000 mL | Freq: Once | INTRAVENOUS | Status: AC | PRN
  Administered 2016-03-24: 11 mL via INTRAVENOUS

## 2016-03-24 MED ORDER — SUPREP BOWEL PREP KIT 17.5-3.13-1.6 GM/177ML PO SOLN: 1.0000 | Freq: Once | ORAL | 0 refills | Status: AC

## 2016-03-24 NOTE — Progress Notes (Signed)
Patient denies any allergies to egg or soy products. No prior surgery. Patient denies oxygen use at home and denies diet medications. Emmi instructions for colonoscopy/endoscopy explained but patient denied.  Pamphlets given.

## 2016-03-26 ENCOUNTER — Other Ambulatory Visit: Payer: Self-pay

## 2016-03-26 DIAGNOSIS — R945 Abnormal results of liver function studies: Principal | ICD-10-CM

## 2016-03-26 DIAGNOSIS — R7989 Other specified abnormal findings of blood chemistry: Secondary | ICD-10-CM

## 2016-04-06 ENCOUNTER — Ambulatory Visit (AMBULATORY_SURGERY_CENTER): Payer: BLUE CROSS/BLUE SHIELD | Admitting: Internal Medicine

## 2016-04-06 ENCOUNTER — Encounter: Payer: Self-pay | Admitting: Internal Medicine

## 2016-04-06 VITALS — BP 106/67 | HR 66 | Temp 99.5°F | Resp 14 | Ht 63.0 in | Wt 122.0 lb

## 2016-04-06 DIAGNOSIS — D125 Benign neoplasm of sigmoid colon: Secondary | ICD-10-CM

## 2016-04-06 DIAGNOSIS — K621 Rectal polyp: Secondary | ICD-10-CM | POA: Diagnosis not present

## 2016-04-06 DIAGNOSIS — K635 Polyp of colon: Secondary | ICD-10-CM

## 2016-04-06 DIAGNOSIS — D128 Benign neoplasm of rectum: Secondary | ICD-10-CM

## 2016-04-06 DIAGNOSIS — K219 Gastro-esophageal reflux disease without esophagitis: Secondary | ICD-10-CM | POA: Diagnosis present

## 2016-04-06 DIAGNOSIS — K2950 Unspecified chronic gastritis without bleeding: Secondary | ICD-10-CM | POA: Diagnosis not present

## 2016-04-06 DIAGNOSIS — D509 Iron deficiency anemia, unspecified: Secondary | ICD-10-CM | POA: Diagnosis not present

## 2016-04-06 DIAGNOSIS — R1013 Epigastric pain: Secondary | ICD-10-CM

## 2016-04-06 DIAGNOSIS — D124 Benign neoplasm of descending colon: Secondary | ICD-10-CM

## 2016-04-06 MED ORDER — SODIUM CHLORIDE 0.9 % IV SOLN: 500.0000 mL | INTRAVENOUS | Status: DC

## 2016-04-06 NOTE — Op Note (Signed)
[place] Patient Name: Heidi Schneider Procedure Date: 04/06/2016 9:26 AM MRN: [id number] Endoscopist: Jerene Bears , MD Age: 40 Referring MD:  Date of Birth: [birth year] Gender: Female Account #: [id number] Procedure:                Colonoscopy Indications:              Iron deficiency anemia Medicines:                Monitored Anesthesia Care Procedure:                Pre-Anesthesia Assessment:                           - Prior to the procedure, a History and Physical                            was performed, and patient medications and                            allergies were reviewed. The patient's tolerance of                            previous anesthesia was also reviewed. The risks                            and benefits of the procedure and the sedation                            options and risks were discussed with the patient.                            All questions were answered, and informed consent                            was obtained. Prior Anticoagulants: The patient has                            taken no previous anticoagulant or antiplatelet                            agents. ASA Grade Assessment: II - A patient with                            mild systemic disease. After reviewing the risks                            and benefits, the patient was deemed in                            satisfactory condition to undergo the procedure.                           After obtaining informed consent, the colonoscope  was passed under direct vision. Throughout the                            procedure, the patient's blood pressure, pulse, and                            oxygen saturations were monitored continuously. The                            Model PCF-[id number]) scope was introduced                            through the anus and advanced to the the cecum,                            identified by appendiceal orifice and  ileocecal                            valve. The colonoscopy was performed without                            difficulty. The patient tolerated the procedure                            well. The quality of the bowel preparation was                            good. The ileocecal valve, appendiceal orifice, and                            rectum were photographed. Scope In: 9:36:58 AM Scope Out: 9:55:51 AM Scope Withdrawal Time: 0 hours 14 minutes 14 seconds  Total Procedure Duration: 0 hours 18 minutes 53 seconds  Findings:                 The digital rectal exam was normal.                           The terminal ileum appeared normal.                           A 5 mm polyp was found in the descending colon. The                            polyp was sessile. The polyp was removed with a                            cold snare. Resection and retrieval were complete.                           A 4 mm polyp was found in the sigmoid colon. The                            polyp was sessile. The polyp  was removed with a                            cold snare. Resection and retrieval were complete.                           A 3 mm polyp was found in the rectum. The polyp was                            sessile. The polyp was removed with a cold biopsy                            forceps. Resection and retrieval were complete.                           The exam was otherwise without abnormality on                            direct and retroflexion views. Complications:            No immediate complications. Estimated Blood Loss:     Estimated blood loss was minimal. Impression:               - The examined portion of the ileum was normal.                           - One 5 mm polyp in the descending colon, removed                            with a cold snare. Resected and retrieved.                           - One 4 mm polyp in the sigmoid colon, removed with                            a cold snare.  Resected and retrieved.                           - One 3 mm polyp in the rectum, removed with a cold                            biopsy forceps. Resected and retrieved.                           - The examination was otherwise normal on direct                            and retroflexion views. Recommendation:           - Patient has a contact number available for                            emergencies. The signs and symptoms of potential  delayed complications were discussed with the                            patient. Return to normal activities tomorrow.                            Written discharge instructions were provided to the                            patient.                           - Resume previous diet.                           - Continue present medications.                           - Await pathology results.                           - Repeat colonoscopy is recommended. The                            colonoscopy date will be determined after pathology                            results from today's exam become available for                            review.                           - Continue Integra iron supplementation daily.                            Repeat CBC and iron studies as previously                            recommended (3 months from start of iron                            replacement) Jerene Bears, MD 04/06/2016 10:03:17 AM This report has been signed electronically.

## 2016-04-06 NOTE — Op Note (Signed)
[place] Patient Name: Heidi Schneider Procedure Date: 04/06/2016 9:27 AM MRN: [id number] Endoscopist: Jerene Bears , MD Age: 40 Referring MD:  Date of Birth: 1975-09-06 Gender: Female Account #: [id number] Procedure:                Upper GI endoscopy Indications:              Iron deficiency anemia, Indigestion, Suspected                            gastro-esophageal reflux disease Medicines:                Monitored Anesthesia Care Procedure:                Pre-Anesthesia Assessment:                           - Prior to the procedure, a History and Physical                            was performed, and patient medications and                            allergies were reviewed. The patient's tolerance of                            previous anesthesia was also reviewed. The risks                            and benefits of the procedure and the sedation                            options and risks were discussed with the patient.                            All questions were answered, and informed consent                            was obtained. Prior Anticoagulants: The patient has                            taken no previous anticoagulant or antiplatelet                            agents. ASA Grade Assessment: II - A patient with                            mild systemic disease. After reviewing the risks                            and benefits, the patient was deemed in                            satisfactory condition to undergo the procedure.  After obtaining informed consent, the endoscope was                            passed under direct vision. Throughout the                            procedure, the patient's blood pressure, pulse, and                            oxygen saturations were monitored continuously. The                            Model GIF-[id number]) scope was introduced                            through the mouth, and advanced  to the second part                            of duodenum. The upper GI endoscopy was                            accomplished without difficulty. The patient                            tolerated the procedure well. Scope In: Scope Out: Findings:                 The examined esophagus was normal.                           The cardia and gastric fundus were normal on                            retroflexion.                           The entire examined stomach was normal. Biopsies                            were taken with a cold forceps for histology and                            Helicobacter pylori testing.                           The examined duodenum was normal. Biopsies for                            histology were taken with a cold forceps for                            evaluation of celiac disease. Complications:            No immediate complications. Estimated Blood Loss:     Estimated blood loss was minimal. Estimated blood  loss was minimal. Impression:               - Normal esophagus.                           - Normal stomach. Biopsied.                           - Normal examined duodenum. Biopsied. Recommendation:           - Patient has a contact number available for                            emergencies. The signs and symptoms of potential                            delayed complications were discussed with the                            patient. Return to normal activities tomorrow.                            Written discharge instructions were provided to the                            patient.                           - Resume previous diet.                           - Continue present medications.                           - Await pathology results.                           - See the other procedure note for documentation of                            additional recommendations. Jerene Bears, MD 04/06/2016 9:59:52 AM This report has  been signed electronically.

## 2016-04-06 NOTE — Progress Notes (Signed)
Called to room to assist during endoscopic procedure.  Patient ID and intended procedure confirmed with present staff. Received instructions for my participation in the procedure from the performing physician.  

## 2016-04-06 NOTE — Patient Instructions (Signed)
YOU HAD AN ENDOSCOPIC PROCEDURE TODAY AT [facility]:   Refer to the procedure report that was given to you for any specific questions about what was found during the examination.  If the procedure report does not answer your questions, please call your gastroenterologist to clarify.  If you requested that your care partner not be given the details of your procedure findings, then the procedure report has been included in a sealed envelope for you to review at your convenience later.  YOU SHOULD EXPECT: Some feelings of bloating in the abdomen. Passage of more gas than usual.  Walking can help get rid of the air that was put into your GI tract during the procedure and reduce the bloating. If you had a lower endoscopy (such as a colonoscopy or flexible sigmoidoscopy) you may notice spotting of blood in your stool or on the toilet paper. If you underwent a bowel prep for your procedure, you may not have a normal bowel movement for a few days.  Please Note:  You might notice some irritation and congestion in your nose or some drainage.  This is from the oxygen used during your procedure.  There is no need for concern and it should clear up in a day or so.  SYMPTOMS TO REPORT IMMEDIATELY:   Following lower endoscopy (colonoscopy or flexible sigmoidoscopy):  Excessive amounts of blood in the stool  Significant tenderness or worsening of abdominal pains  Swelling of the abdomen that is new, acute  Fever of 100F or higher    For urgent or emergent issues, a gastroenterologist can be reached at any hour by calling [phone].   DIET:  We do recommend a small meal at first, but then you may proceed to your regular diet.  Drink plenty of fluids but you should avoid alcoholic beverages for 24 hours.  ACTIVITY:  You should plan to take it easy for the rest of today and you should NOT DRIVE or use heavy machinery until tomorrow (because of the sedation medicines used during the test).     FOLLOW UP: Our staff will call the number listed on your records the next business day following your procedure to check on you and address any questions or concerns that you may have regarding the information given to you following your procedure. If we do not reach you, we will leave a message.  However, if you are feeling well and you are not experiencing any problems, there is no need to return our call.  We will assume that you have returned to your regular daily activities without incident.  If any biopsies were taken you will be contacted by phone or by letter within the next 1-3 weeks.  Please call us at [phone] if you have not heard about the biopsies in 3 weeks.    SIGNATURES/CONFIDENTIALITY: You and/or your care partner have signed paperwork which will be entered into your electronic medical record.  These signatures attest to the fact that that the information above on your After Visit Summary has been reviewed and is understood.  Full responsibility of the confidentiality of this discharge information lies with you and/or your care-partner.    Information on polyps given to you today  Continue Integra Iron supplement daily  See follow up appointment with dr Hilarie Fredrickson

## 2016-04-06 NOTE — Progress Notes (Signed)
To PACU, vss patent aw report to rn 

## 2016-04-07 ENCOUNTER — Telehealth: Payer: Self-pay

## 2016-04-07 NOTE — Telephone Encounter (Signed)
  Follow up Call-  Call back number 04/06/2016  Post procedure Call Back phone  # [phone]  Permission to leave phone message Yes  Some recent data might be hidden     Patient questions:  Do you have a fever, pain , or abdominal swelling? No. Pain Score  0 *  Have you tolerated food without any problems? Yes.    Have you been able to return to your normal activities? Yes.    Do you have any questions about your discharge instructions: Diet   No. Medications  No. Follow up visit  No.  Do you have questions or concerns about your Care? No.  Actions: * If pain score is 4 or above: No action needed, pain <4.

## 2016-04-08 ENCOUNTER — Encounter: Payer: Self-pay | Admitting: Internal Medicine

## 2016-05-27 ENCOUNTER — Encounter: Payer: Self-pay | Admitting: *Deleted

## 2016-06-22 ENCOUNTER — Ambulatory Visit (INDEPENDENT_AMBULATORY_CARE_PROVIDER_SITE_OTHER): Payer: BLUE CROSS/BLUE SHIELD | Admitting: Internal Medicine

## 2016-06-22 ENCOUNTER — Encounter: Payer: Self-pay | Admitting: Internal Medicine

## 2016-06-22 ENCOUNTER — Ambulatory Visit: Payer: BLUE CROSS/BLUE SHIELD | Admitting: Internal Medicine

## 2016-06-22 ENCOUNTER — Other Ambulatory Visit (INDEPENDENT_AMBULATORY_CARE_PROVIDER_SITE_OTHER): Payer: BLUE CROSS/BLUE SHIELD

## 2016-06-22 VITALS — BP 102/72 | HR 80 | Ht 61.5 in | Wt 125.0 lb

## 2016-06-22 DIAGNOSIS — D509 Iron deficiency anemia, unspecified: Secondary | ICD-10-CM

## 2016-06-22 DIAGNOSIS — K219 Gastro-esophageal reflux disease without esophagitis: Secondary | ICD-10-CM | POA: Diagnosis not present

## 2016-06-22 LAB — CBC WITH DIFFERENTIAL/PLATELET
Basophils Absolute: 0 10*3/uL (ref 0.0–0.1)
Basophils Relative: 0.5 % (ref 0.0–3.0)
EOS ABS: 0 10*3/uL (ref 0.0–0.7)
Eosinophils Relative: 0.9 % (ref 0.0–5.0)
HEMATOCRIT: 37.3 % (ref 36.0–46.0)
Hemoglobin: 12.6 g/dL (ref 12.0–15.0)
LYMPHS ABS: 1.8 10*3/uL (ref 0.7–4.0)
LYMPHS PCT: 32.3 % (ref 12.0–46.0)
MCHC: 33.7 g/dL (ref 30.0–36.0)
MCV: 86.1 fl (ref 78.0–100.0)
Monocytes Absolute: 0.6 10*3/uL (ref 0.1–1.0)
Monocytes Relative: 11.2 % (ref 3.0–12.0)
NEUTROS ABS: 3 10*3/uL (ref 1.4–7.7)
NEUTROS PCT: 55.1 % (ref 43.0–77.0)
PLATELETS: 228 10*3/uL (ref 150.0–400.0)
RBC: 4.33 Mil/uL (ref 3.87–5.11)
RDW: 14.6 % (ref 11.5–15.5)
WBC: 5.5 10*3/uL (ref 4.0–10.5)

## 2016-06-22 LAB — IBC PANEL
Iron: 32 ug/dL — ABNORMAL LOW (ref 42–145)
SATURATION RATIOS: 8.6 % — AB (ref 20.0–50.0)
TRANSFERRIN: 265 mg/dL (ref 212.0–360.0)

## 2016-06-22 LAB — FERRITIN: Ferritin: 19.2 ng/mL (ref 10.0–291.0)

## 2016-06-22 NOTE — Patient Instructions (Signed)
Your physician has requested that you go to the basement for the following lab work before leaving today: CBC, IBC, ferritin  Discontinue protonix for now. If your pain returns, you may restart the medicine, but we would like you to call and let us know that you have restarted it.  If you are age 43 or older, your body mass index should be between 23-30. Your Body mass index is 23.24 kg/m. If this is out of the aforementioned range listed, please consider follow up with your Primary Care Provider.  If you are age 43 or younger, your body mass index should be between 19-25. Your Body mass index is 23.24 kg/m. If this is out of the aformentioned range listed, please consider follow up with your Primary Care Provider.

## 2016-06-22 NOTE — Progress Notes (Signed)
Subjective:    Patient ID: Heidi Schneider, female    DOB: 05/19/1976, 41 y.o.   MRN: [id number]  HPI Heidi Schneider is a 41 year old female with history of resolved hepatitis C, GERD and dyspepsia, iron deficiency anemia who is here for follow-up. She was seen in the office in October 2017 and came for upper endoscopy and colonoscopy on 04/06/2016   EGD was normal. Biopsies were performed from the stomach which showed mild chronic inactive gastritis. No H. pylori or metaplasia. Duodenal biopsies were normal.  Colonoscopy revealed a normal terminal ileum and 3, 3-5 mm polyps removed from the left colon and rectum. Exam was otherwise normal. These polyps were found to be hyperplastic polyps without evidence of adenomatous change  She was started on oral iron and pantoprazole for dyspepsia.  She reports that her epigastric abdominal pain, dyspepsia and reflux symptoms resolved entirely. She's been feeling very well. She denies blood in her stool or melena. No other sources of blood loss. No further abdominal pain. Bowel habits regular without diarrhea or constipation.  She has been taking and tolerating oral iron once daily. She's noted darker stools but again no visible blood or melena  Review of Systems As per history of present illness, otherwise negative    Objective:   Physical Exam BP 102/72 (BP Location: Left Arm, Patient Position: Sitting, Cuff Size: Normal)   Pulse 80   Ht 5' 1.5" (1.562 m) Comment: height measured without shoes  Wt 125 lb (56.7 kg)   LMP 06/04/2016   BMI 23.24 kg/m  Constitutional: Well-developed and well-nourished. No distress. HEENT: Normocephalic and atraumatic.  Conjunctivae are normal.  No scleral icterus. Neck: Neck supple. Trachea midline. Cardiovascular: Normal rate, regular rhythm and intact distal pulses. No M/R/G Pulmonary/chest: Effort normal and breath sounds normal. No wheezing, rales or rhonchi. Abdominal: Soft, nontender, nondistended. Bowel sounds  active throughout. There are no masses palpable. No hepatosplenomegaly. Extremities: no clubbing, cyanosis, or edema Neurological: Alert and oriented to person place and time. Skin: Skin is warm and dry.  Psychiatric: Normal mood and affect. Behavior is normal.  CBC    Component Value Date/Time   WBC 5.5 06/22/2016 1458   RBC 4.33 06/22/2016 1458   HGB 12.6 06/22/2016 1458   HCT 37.3 06/22/2016 1458   PLT 228.0 06/22/2016 1458   MCV 86.1 06/22/2016 1458   MCV 73.2 (A) 05/01/2014 1838   MCH 22.3 (A) 05/01/2014 1838   MCHC 33.7 06/22/2016 1458   RDW 14.6 06/22/2016 1458   LYMPHSABS 1.8 06/22/2016 1458   MONOABS 0.6 06/22/2016 1458   EOSABS 0.0 06/22/2016 1458   BASOSABS 0.0 06/22/2016 1458   Iron/TIBC/Ferritin/ %Sat    Component Value Date/Time   IRON 32 (L) 06/22/2016 1458   TIBC 430 05/01/2014 1902   FERRITIN 19.2 06/22/2016 1458   IRONPCTSAT 8.6 (L) 06/22/2016 1458   IRONPCTSAT 4 (L) 05/01/2014 1902       Assessment & Plan:  41 year old female with history of resolved hepatitis C, GERD and dyspepsia, iron deficiency anemia who is here for follow-up.  1. GERD/dyspepsia -- improved with pantoprazole and resolved entirely. I'm going to try to discontinue pantoprazole to see if her GERD or dyspeptic symptoms return. If so my instruction will be to resume this medication if symptoms are frequent and bothersome. She understands this recommendation and will notify me if she needs to resume PPI. In this case she would need annual follow-up for management/prescription  2. IDA -- no GI source after  EGD, colonoscopy.  Expect element of menstrual bleeding contributing to IDA. Repeat CBC and iron studies today  3. Cholelithiasis -- known gallstones, though given complete response to PPI and resolution of upper abdominal symptoms I expect this was GERD or dyspepsia related. She understands that if upper abdominal right upper quadrant pain occur to notify my office.  15 minutes spent  with the patient today. Greater than 50% was spent in counseling and coordination of care with the patient

## 2016-06-23 ENCOUNTER — Other Ambulatory Visit: Payer: Self-pay

## 2016-06-23 DIAGNOSIS — D649 Anemia, unspecified: Secondary | ICD-10-CM

## 2016-06-24 ENCOUNTER — Telehealth: Payer: Self-pay | Admitting: Internal Medicine

## 2016-06-24 NOTE — Telephone Encounter (Signed)
Patient states that after stopping the Protonix she had abdominal pain, this resolved when she restarted the medication but wanted to let you know. Patient said that she will be running out of this medication. Asked her to contact her pharmacy to have them request refill.

## 2016-06-25 NOTE — Telephone Encounter (Signed)
Okay to resume pantoprazole Office followup in 1 yr

## 2016-06-25 NOTE — Progress Notes (Signed)
Letter mailed

## 2016-06-28 ENCOUNTER — Telehealth: Payer: Self-pay | Admitting: Internal Medicine

## 2016-06-28 MED ORDER — PANTOPRAZOLE SODIUM 40 MG PO TBEC: 40.0000 mg | DELAYED_RELEASE_TABLET | Freq: Two times a day (BID) | ORAL | 3 refills | Status: DC

## 2016-06-28 NOTE — Telephone Encounter (Signed)
Pt was instructed to stop her pantoprazole on 06/22/16. She states she started having the discomfort again on 06/24/16 and she resumed the pantoprazole and it helped. States she has continued to take the med but this morning she still had discomfort for about 20-57minutes. She is taking the pantoprazole 40mg  daily right now. Please advise.

## 2016-06-28 NOTE — Telephone Encounter (Signed)
Spoke with pt and she is aware. New script sent to pharmacy with new instruction.

## 2016-06-28 NOTE — Telephone Encounter (Signed)
Can do BID-AC x 2 weeks then back to once daily. I expect things to calm back down soon If not, she can let me know, she also has gallstones, but at followup sypmtoms were well controlled before attempt to d/c PPI

## 2016-09-29 ENCOUNTER — Other Ambulatory Visit (INDEPENDENT_AMBULATORY_CARE_PROVIDER_SITE_OTHER): Payer: BLUE CROSS/BLUE SHIELD

## 2016-09-29 DIAGNOSIS — D649 Anemia, unspecified: Secondary | ICD-10-CM

## 2016-09-29 LAB — CBC WITH DIFFERENTIAL/PLATELET
BASOS ABS: 0 10*3/uL (ref 0.0–0.1)
Basophils Relative: 0.6 % (ref 0.0–3.0)
EOS ABS: 0 10*3/uL (ref 0.0–0.7)
Eosinophils Relative: 0.7 % (ref 0.0–5.0)
HEMATOCRIT: 37.4 % (ref 36.0–46.0)
Hemoglobin: 12.2 g/dL (ref 12.0–15.0)
LYMPHS ABS: 1.9 10*3/uL (ref 0.7–4.0)
LYMPHS PCT: 35 % (ref 12.0–46.0)
MCHC: 32.7 g/dL (ref 30.0–36.0)
MCV: 91.2 fl (ref 78.0–100.0)
MONOS PCT: 8.2 % (ref 3.0–12.0)
Monocytes Absolute: 0.4 10*3/uL (ref 0.1–1.0)
NEUTROS ABS: 3 10*3/uL (ref 1.4–7.7)
NEUTROS PCT: 55.5 % (ref 43.0–77.0)
PLATELETS: 228 10*3/uL (ref 150.0–400.0)
RBC: 4.1 Mil/uL (ref 3.87–5.11)
RDW: 13.4 % (ref 11.5–15.5)
WBC: 5.3 10*3/uL (ref 4.0–10.5)

## 2016-09-29 LAB — IBC PANEL
Iron: 111 ug/dL (ref 42–145)
SATURATION RATIOS: 32.5 % (ref 20.0–50.0)
TRANSFERRIN: 244 mg/dL (ref 212.0–360.0)

## 2016-09-29 LAB — FERRITIN: Ferritin: 25.4 ng/mL (ref 10.0–291.0)

## 2016-11-23 ENCOUNTER — Other Ambulatory Visit: Payer: Self-pay | Admitting: Internal Medicine

## 2016-12-26 ENCOUNTER — Other Ambulatory Visit: Payer: Self-pay | Admitting: Internal Medicine

## 2017-06-06 ENCOUNTER — Encounter: Payer: Self-pay | Admitting: Internal Medicine

## 2017-09-16 ENCOUNTER — Other Ambulatory Visit: Payer: Self-pay | Admitting: Internal Medicine

## 2017-10-06 ENCOUNTER — Ambulatory Visit (INDEPENDENT_AMBULATORY_CARE_PROVIDER_SITE_OTHER): Payer: BLUE CROSS/BLUE SHIELD | Admitting: Internal Medicine

## 2017-10-06 ENCOUNTER — Encounter: Payer: Self-pay | Admitting: Internal Medicine

## 2017-10-06 VITALS — BP 116/74 | HR 72 | Ht 61.5 in | Wt 124.1 lb

## 2017-10-06 DIAGNOSIS — R1013 Epigastric pain: Secondary | ICD-10-CM

## 2017-10-06 DIAGNOSIS — K219 Gastro-esophageal reflux disease without esophagitis: Secondary | ICD-10-CM

## 2017-10-06 MED ORDER — PANTOPRAZOLE SODIUM 40 MG PO TBEC: 40.0000 mg | DELAYED_RELEASE_TABLET | Freq: Every day | ORAL | 3 refills | Status: DC

## 2017-10-06 NOTE — Patient Instructions (Signed)
If you are age 44 or older, your body mass index should be between 23-30. Your Body mass index is 23.07 kg/m. If this is out of the aforementioned range listed, please consider follow up with your Primary Care Provider.  If you are age 76 or younger, your body mass index should be between 19-25. Your Body mass index is 23.07 kg/m. If this is out of the aformentioned range listed, please consider follow up with your Primary Care Provider.   We have sent the following medications to your pharmacy for you to pick up at your convenience: Pantoprazole 40 mg  You will be placed in recall for your annual visit.  Thank you for choosing me and [facility].  Dr. Billie Lade

## 2017-10-06 NOTE — Progress Notes (Signed)
   Subjective:    Patient ID: Heidi Schneider, female    DOB: 1976-05-09, 42 y.o.   MRN: [id number]  HPI Heidi Schneider is a 42 year old female with a history of resolved hepatitis C, GERD with dyspepsia predominantly epigastric pain, history of iron deficiency anemia who is here for follow-up.  She was last seen on 06/22/2016.  She had upper endoscopy and colonoscopy in November 2017 to evaluate symptoms but also IDA.  After her last visit her epigastric pain and reflux symptoms had resolved entirely.  I took her off of pantoprazole which she remained off of for several weeks.  She called back with recurrent epigastric abdominal pain.  She started pantoprazole back and symptoms resolved entirely.  Today she feels very well with no GI complaints whatsoever.  Normal bowel habits.  No blood in her stool or melena.  No abdominal pain.  No reflux, dysphagia or odynophagia.   Review of Systems As per HPI, otherwise negative  Current Medications, Allergies, Past Medical History, Past Surgical History, Family History and Social History were reviewed in [employer] record.     Objective:   Physical Exam BP 116/74 (BP Location: Left Arm, Patient Position: Sitting, Cuff Size: Normal)   Pulse 72   Ht 5' 1.5" (1.562 m)   Wt 124 lb 2 oz (56.3 kg)   LMP 09/09/2017   BMI 23.07 kg/m  Constitutional: Well-developed and well-nourished. No distress. HEENT: Normocephalic and atraumatic. Conjunctivae are normal.  No scleral icterus. Neck: Neck supple. Trachea midline. Cardiovascular: Normal rate, regular rhythm and intact distal pulses. No M/R/G Pulmonary/chest: Effort normal and breath sounds normal. No wheezing, rales or rhonchi. Abdominal: Soft, nontender, nondistended. Bowel sounds active throughout. There are no masses palpable. No hepatosplenomegaly. Extremities: no clubbing, cyanosis, or edema Neurological: Alert and oriented to person place and time. Skin: Skin is warm and  dry. Psychiatric: Normal mood and affect. Behavior is normal.  Iron/TIBC/Ferritin/ %Sat    Component Value Date/Time   IRON 111 09/29/2016 1632   TIBC 430 05/01/2014 1902   FERRITIN 25.4 09/29/2016 1632   IRONPCTSAT 32.5 09/29/2016 1632   IRONPCTSAT 4 (L) 05/01/2014 1902        Assessment & Plan:  42 year old female with a history of resolved hepatitis C, GERD with dyspepsia predominantly epigastric pain, history of iron deficiency anemia who is here for follow-up.   1.  GERD/dyspepsia with epigastric pain --symptoms resolved entirely on pantoprazole.  She wishes to continue this medication as symptoms returned after discontinuation of PPI.  She does not have H. pylori or gastric metaplasia.  Duodenal biopsies previously were normal.  We discussed the risk, benefits and alternatives to chronic PPI and she wishes to continue therapy.  Continue pantoprazole 40 mg daily; okay for 90-day supply with 3 refills  2.  History of IDA --resolved with normal iron studies when checked in May 2018  Annual follow-up 15 minutes spent with the patient today. Greater than 50% was spent in counseling and coordination of care with the patient

## 2017-10-28 IMAGING — MR MR ABDOMEN WO/W CM MRCP
11 of 23 series · 22 of 48 positions shown · IV contrast (multihance)
Comparison: Abdominal ultrasound dated 05/15/2014

CLINICAL DATA: Dilated CBD

EXAM:
MRI ABDOMEN WITHOUT AND WITH CONTRAST (INCLUDING MRCP)
TECHNIQUE: Multiplanar multisequence MR imaging of the abdomen was performed
both before and after the administration of intravenous contrast.
Heavily T2-weighted images of the biliary and pancreatic ducts were
obtained, and three-dimensional MRCP images were rendered by post
processing.
CONTRAST:  11mL MULTIHANCE GADOBENATE DIMEGLUMINE 529 MG/ML IV SOLN

[Series 4: T2 fat-sat · axial · 5.0mm · 0.78mm/px · z∈[+45,+220]mm · 2 of 36 slices shown]
[im 1/36]
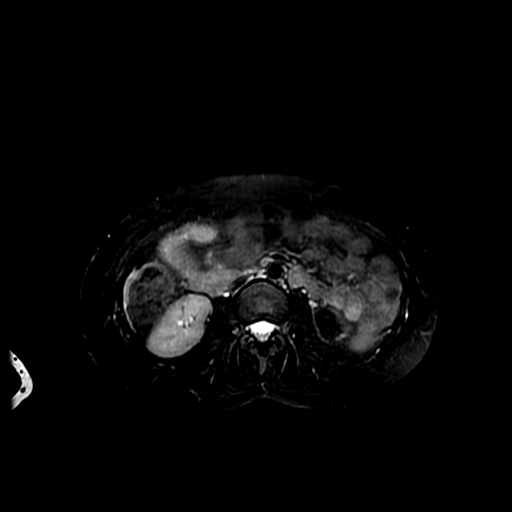
[im 36/36]
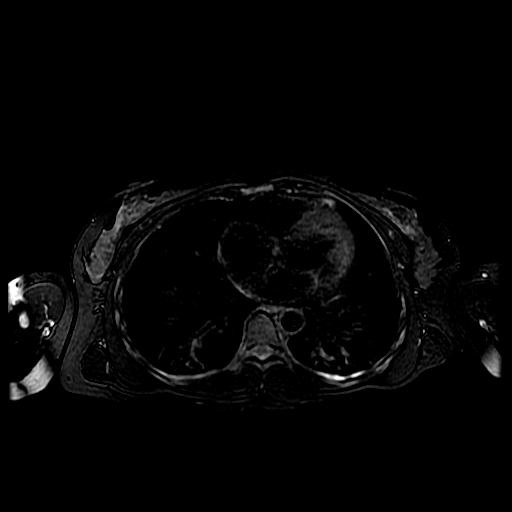

[Series 5: DWI b500 · axial · 6.0mm · 1.48mm/px · z∈[+18,+244]mm · 3 of 60 slices shown]
[im 1/60]
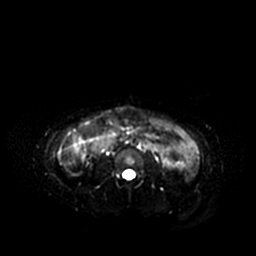
[im 30/60]
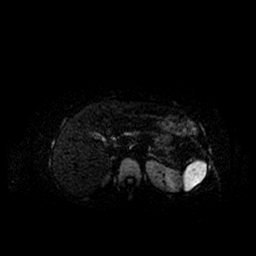
[im 60/60]
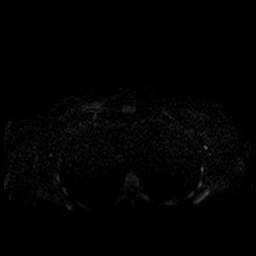

[Series 7: MRCP · coronal · 2.0mm · 0.70mm/px · 1 of 39 slices shown (1 of 2)]
[im 1/39]
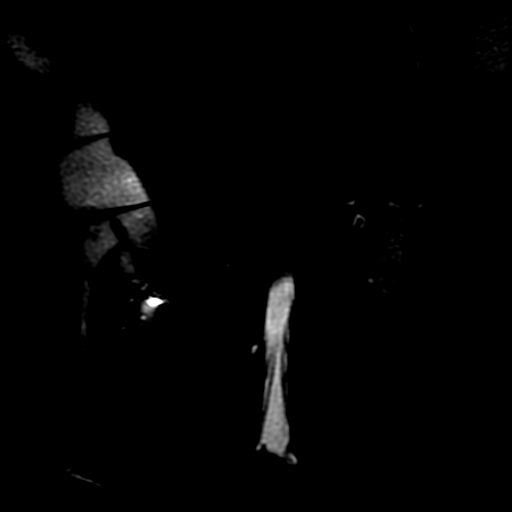

[Series 10: ax dualecho · axial · 5.0mm · 0.78mm/px · z∈[+20,+205]mm · 2 of 76 slices shown]
[im 1/76]
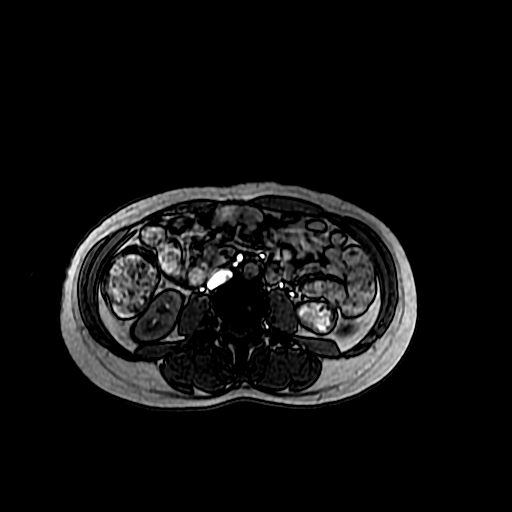
[im 76/76]
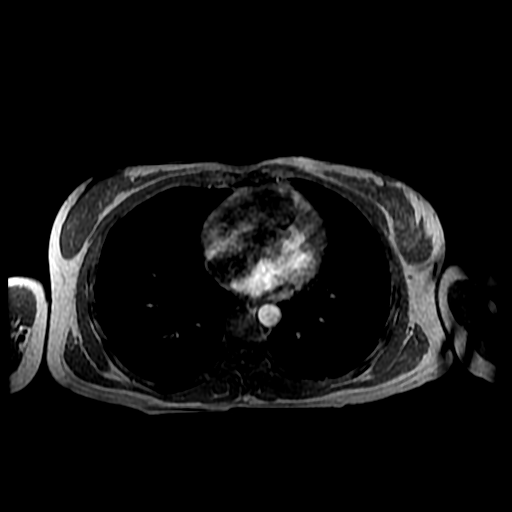

[Series 11: MRCP · sagittal · 40.0mm · 0.70mm/px · 1 of 5 slices shown (2 of 2)]
[im 1/5]
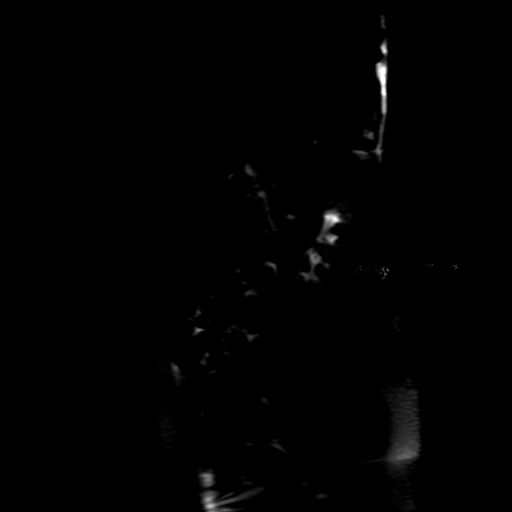

[Series 12: bSSFP fat-sat · coronal · 5.0mm · 0.70mm/px · 1 of 35 slices shown]
[im 1/35]
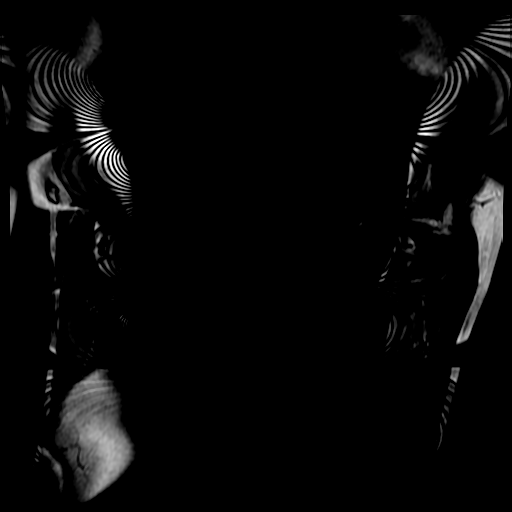

[Series 13: T2 · axial · 5.0mm · 0.78mm/px · 1 of 38 slices shown]
[im 1/38]
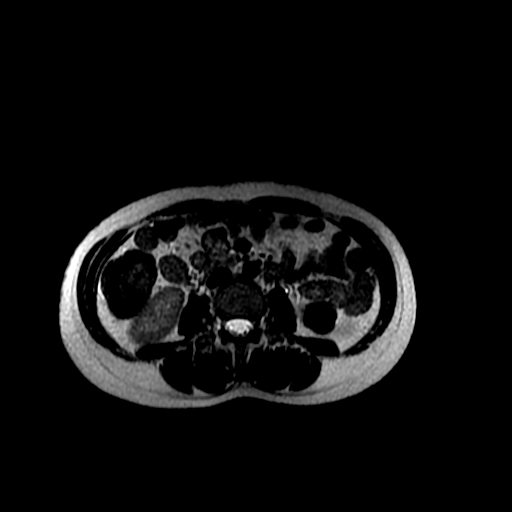

[Series 15: T1 dynamic · coronal · delayed · 4.0mm · 0.78mm/px · 3 of 112 slices shown]
[im 1/112]
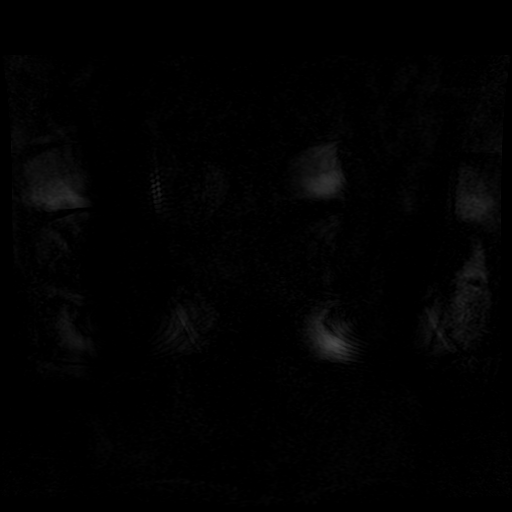
[im 56/112]
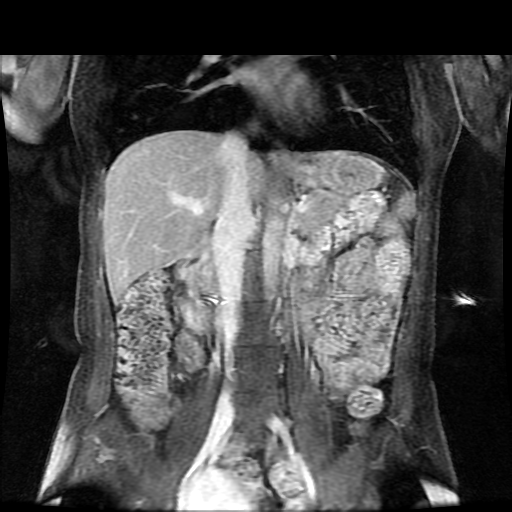
[im 112/112]
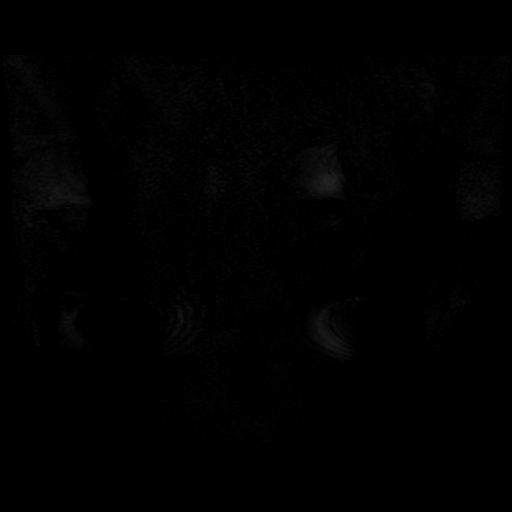

[Series 502: DWI · axial · 6.0mm · 1.48mm/px · 1 of 30 slices shown]
[im 1/30]
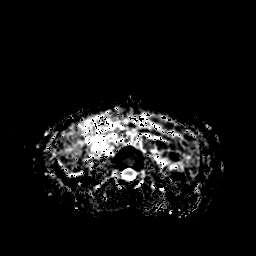

[Series 600: reformatted · axial · 1.6mm · 0.62mm/px · z∈[+99,+226]mm · 3 of 114 slices shown (1 of 2)]
[im 1/114]
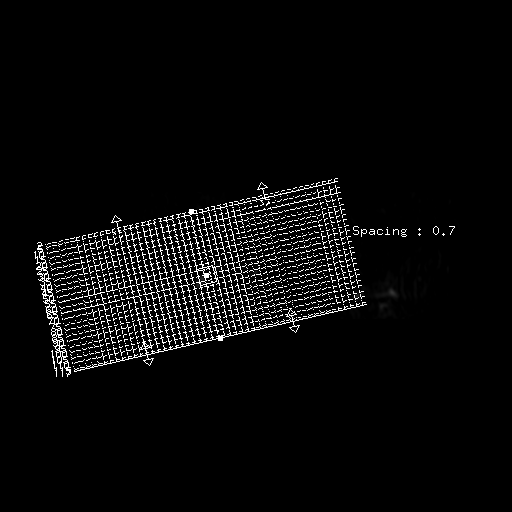
[im 57/114]
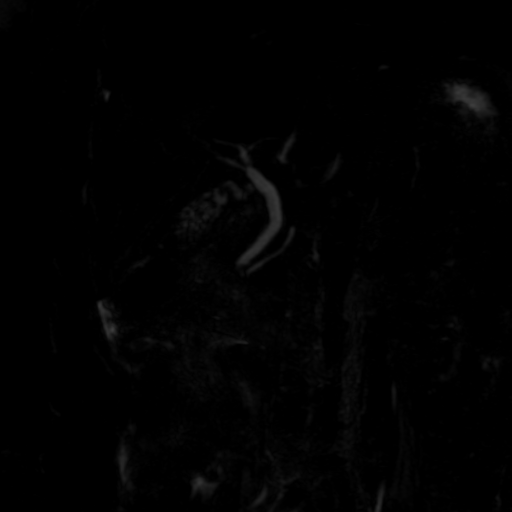
[im 114/114]
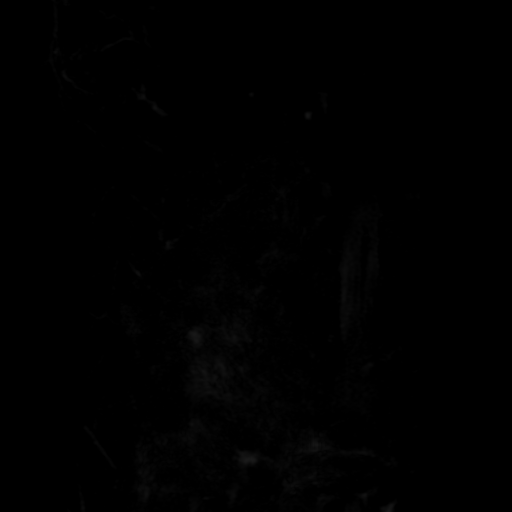

[Series 601: reformatted · axial · 1.6mm · 0.62mm/px · z∈[+99,+226]mm · 4 of 148 slices shown (2 of 2)]
[im 1/148]
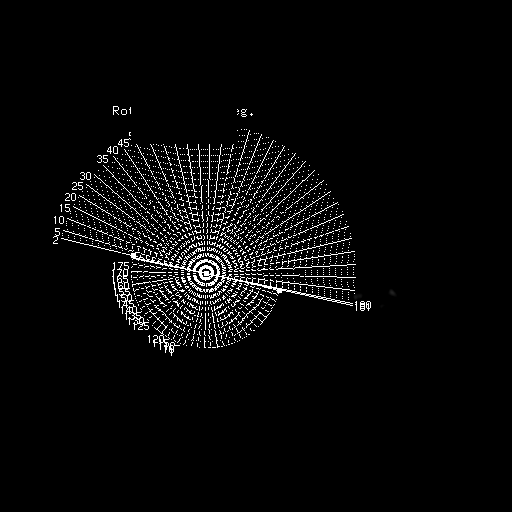
[im 50/148]
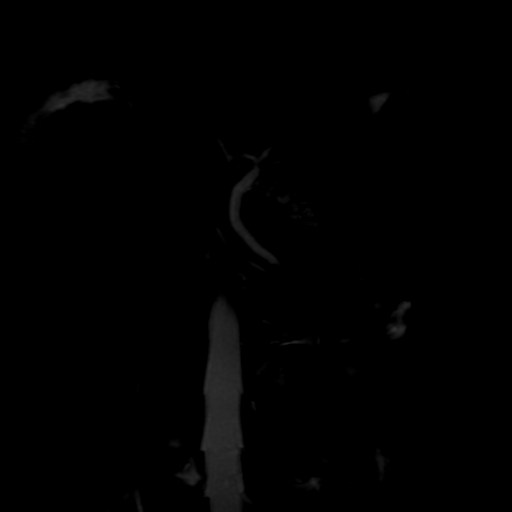
[im 99/148]
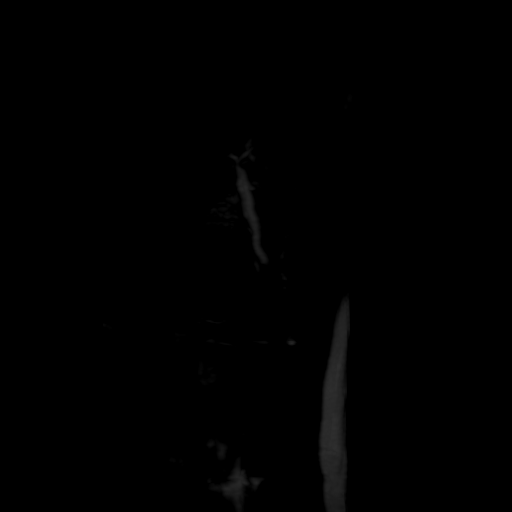
[im 148/148]
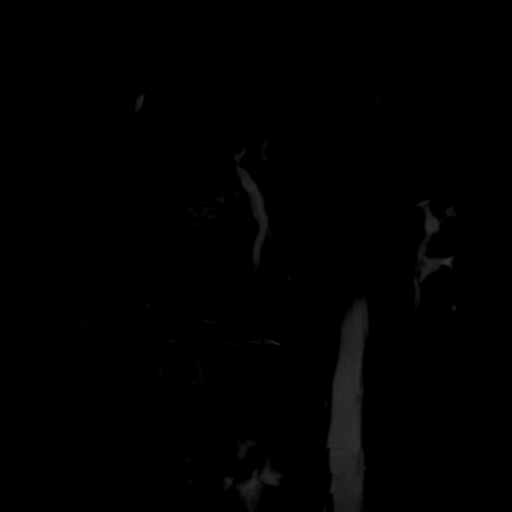

[22 of 48 positions shown; findings below may reference images not displayed]

FINDINGS: Lower chest: Lung bases are clear.

Hepatobiliary: Liver is within normal limits. No
suspicious/enhancing hepatic lesions.

Cholelithiasis, without associated inflammatory changes.

Common duct measures 6 mm, within normal limits, and smoothly tapers
at the ampulla. No choledocholithiasis is seen.

Pancreas:  Within normal limits.

Spleen:  Within normal limits.

Adrenals/Urinary Tract:  Adrenal glands are within normal limits.

7 mm left upper pole renal cyst (series 4/image 20). Right kidney is
within normal limits. No hydronephrosis.

Stomach/Bowel: Stomach is within normal limits.

Visualized bowel is unremarkable.

Vascular/Lymphatic:  No evidence of abdominal aortic aneurysm.

No suspicious abdominal lymphadenopathy.

Other:  No abdominal ascites.

Musculoskeletal: No focal osseous lesions.
IMPRESSION: Cholelithiasis, without associated inflammatory changes.

No intrahepatic or extrahepatic ductal dilatation. No
choledocholithiasis is seen.

## 2018-09-20 ENCOUNTER — Encounter: Payer: Self-pay | Admitting: Internal Medicine

## 2018-09-28 ENCOUNTER — Other Ambulatory Visit: Payer: Self-pay | Admitting: Internal Medicine

## 2018-10-03 ENCOUNTER — Other Ambulatory Visit: Payer: Self-pay | Admitting: Internal Medicine

## 2018-10-25 ENCOUNTER — Ambulatory Visit (INDEPENDENT_AMBULATORY_CARE_PROVIDER_SITE_OTHER): Payer: BLUE CROSS/BLUE SHIELD | Admitting: Internal Medicine

## 2018-10-25 ENCOUNTER — Encounter: Payer: Self-pay | Admitting: Internal Medicine

## 2018-10-25 ENCOUNTER — Other Ambulatory Visit: Payer: Self-pay

## 2018-10-25 VITALS — Ht 62.0 in | Wt 125.0 lb

## 2018-10-25 DIAGNOSIS — R1013 Epigastric pain: Secondary | ICD-10-CM

## 2018-10-25 DIAGNOSIS — K219 Gastro-esophageal reflux disease without esophagitis: Secondary | ICD-10-CM | POA: Diagnosis not present

## 2018-10-25 DIAGNOSIS — Z8639 Personal history of other endocrine, nutritional and metabolic disease: Secondary | ICD-10-CM

## 2018-10-25 MED ORDER — PANTOPRAZOLE SODIUM 40 MG PO TBEC: 40.0000 mg | DELAYED_RELEASE_TABLET | Freq: Every day | ORAL | 3 refills | Status: DC

## 2018-10-25 NOTE — Addendum Note (Signed)
Addended by: Larina Bras on: 10/25/2018 02:24 PM   Modules accepted: Orders

## 2018-10-25 NOTE — Patient Instructions (Addendum)
We have sent the following medications to your pharmacy for you to pick up at your convenience: Continue pantoprazole 40 mg, 30 minutes before breakfast daily  You will be due for a recall colonoscopy in 2027. We will send you a reminder in the mail when it gets closer to that time.  Please follow up with Dr Hilarie Fredrickson in 1 year in the office.

## 2018-10-25 NOTE — Progress Notes (Signed)
   Subjective:    Patient ID: Heidi Schneider, female    DOB: November 23, 1975, 43 y.o.   MRN: [id number]  This service was provided via telemedicine.  D[employer] app with A/V communication The patient was located at work. The provider was located in provider's GI office. The patient did consent to this telephone visit and is aware of possible charges through their insurance for this visit.   The persons participating in this telemedicine service were the patient and I. Time spent on call: 7 minutes   HPI Heidi Schneider is a 43 year old female with a past medical history of resolved hepatitis C, GERD with dyspepsia with associated epigastric pain, history of iron deficiency anemia who is here for follow-up.  She is seen virtually today through the [employer] app.  Last seen 10/06/2017.  She reports she has been doing well as long as she takes her pantoprazole.  No epigastric pain.  No heartburn.  No dyspeptic symptoms.  No pyrosis, dysphagia or odynophagia.  Bowel movements regular without diarrhea or constipation.  No blood in stool or melena.  No new complaints.  Reports she feels well.  She has been back to work about 2 weeks.  She had a colonoscopy in 2017 with hyperplastic polyps removed from the distal colon.  10-year recall was recommended.    Review of Systems As per HPI, otherwise negative  Current Medications, Allergies, Past Medical History, Past Surgical History, Family History and Social History were reviewed in [employer] record.     Objective:   Physical Exam Physical exam, virtual visit       Assessment & Plan:   43 year old female with a past medical history of resolved hepatitis C, GERD with dyspepsia with associated epigastric pain, history of iron deficiency anemia who is here for follow-up.   1. GERD/dyspepsia --stable without alarm symptom.  Pantoprazole works well for her.  We reviewed the risks, benefits and alternatives to chronic PPI use.  She prefers to  stay on therapy. --Continue pantoprazole 40 mg, 30 minutes before breakfast daily Refill x1 year follow-up in 1 year  2.  History of IDA --resolved, prior upper endoscopy and colonoscopy.  Primary care to follow routine blood work.  3.  Colon cancer screening --colonoscopy with distal hyperplastic polyps only in 2017, repeat in 10 years which would be 2027

## 2019-10-07 ENCOUNTER — Other Ambulatory Visit: Payer: Self-pay | Admitting: Internal Medicine

## 2019-11-01 ENCOUNTER — Telehealth: Payer: Self-pay | Admitting: Internal Medicine

## 2019-11-01 MED ORDER — PANTOPRAZOLE SODIUM 40 MG PO TBEC: 40.0000 mg | DELAYED_RELEASE_TABLET | Freq: Every day | ORAL | 0 refills | Status: DC

## 2019-11-01 NOTE — Telephone Encounter (Signed)
Patient currently is scheduled for 01/14/20

## 2019-11-01 NOTE — Telephone Encounter (Signed)
Refill x 90 days has been sent to pharmacy until upcoming appointment in August.

## 2019-11-01 NOTE — Telephone Encounter (Signed)
Will be glad to refill after she schedules office visit as noted on refill request 10/07/19. Thx

## 2019-11-01 NOTE — Telephone Encounter (Signed)
Patient requesting refill on Protonix 

## 2019-12-05 ENCOUNTER — Other Ambulatory Visit: Payer: Self-pay | Admitting: Family Medicine

## 2019-12-05 DIAGNOSIS — Z1231 Encounter for screening mammogram for malignant neoplasm of breast: Secondary | ICD-10-CM

## 2019-12-05 DIAGNOSIS — E7800 Pure hypercholesterolemia, unspecified: Secondary | ICD-10-CM | POA: Diagnosis not present

## 2019-12-05 DIAGNOSIS — Z0000 Encounter for general adult medical examination without abnormal findings: Secondary | ICD-10-CM | POA: Diagnosis not present

## 2019-12-05 DIAGNOSIS — R7309 Other abnormal glucose: Secondary | ICD-10-CM | POA: Diagnosis not present

## 2019-12-05 DIAGNOSIS — Z1151 Encounter for screening for human papillomavirus (HPV): Secondary | ICD-10-CM | POA: Diagnosis not present

## 2019-12-05 DIAGNOSIS — Z124 Encounter for screening for malignant neoplasm of cervix: Secondary | ICD-10-CM | POA: Diagnosis not present

## 2019-12-05 DIAGNOSIS — R946 Abnormal results of thyroid function studies: Secondary | ICD-10-CM | POA: Diagnosis not present

## 2019-12-19 ENCOUNTER — Ambulatory Visit
Admission: RE | Admit: 2019-12-19 | Discharge: 2019-12-19 | Disposition: A | Discharge status: Home / Self Care | Payer: BC Managed Care – PPO | Source: Ambulatory Visit | Attending: Family Medicine | Admitting: Family Medicine

## 2019-12-19 ENCOUNTER — Other Ambulatory Visit: Payer: Self-pay

## 2019-12-19 DIAGNOSIS — Z1231 Encounter for screening mammogram for malignant neoplasm of breast: Secondary | ICD-10-CM

## 2019-12-20 ENCOUNTER — Encounter: Payer: Self-pay | Admitting: *Deleted

## 2020-01-14 ENCOUNTER — Encounter: Payer: Self-pay | Admitting: Internal Medicine

## 2020-01-14 ENCOUNTER — Ambulatory Visit (INDEPENDENT_AMBULATORY_CARE_PROVIDER_SITE_OTHER): Payer: BC Managed Care – PPO | Admitting: Internal Medicine

## 2020-01-14 VITALS — BP 100/70 | HR 83 | Ht 62.0 in | Wt 130.0 lb

## 2020-01-14 DIAGNOSIS — K219 Gastro-esophageal reflux disease without esophagitis: Secondary | ICD-10-CM

## 2020-01-14 DIAGNOSIS — Z8639 Personal history of other endocrine, nutritional and metabolic disease: Secondary | ICD-10-CM | POA: Diagnosis not present

## 2020-01-14 DIAGNOSIS — R1013 Epigastric pain: Secondary | ICD-10-CM | POA: Diagnosis not present

## 2020-01-14 MED ORDER — PANTOPRAZOLE SODIUM 20 MG PO TBEC: 20.0000 mg | DELAYED_RELEASE_TABLET | Freq: Every day | ORAL | 0 refills | Status: DC

## 2020-01-14 NOTE — Progress Notes (Signed)
   Subjective:    Patient ID: Heidi Schneider, female    DOB: [birth year], 44 y.o.   MRN: [id number]  HPI Heidi Schneider is a 44 year old female with a history of resolved hepatitis C, GERD with dyspepsia associated with epigastric pain, history of IDA who is here for follow-up.  She is seen in person today and was last seen in May 2020.  She has been maintained on pantoprazole 40 mg a day.  She is doing well with no epigastric pain or heartburn symptoms.  No dyspeptic symptoms.  No dysphagia or odynophagia.  No nausea vomiting.  Regular bowel movements without blood or melena.  She had upper endoscopy and colonoscopy in 2017.   Review of Systems As per HPI, otherwise negative  Current Medications, Allergies, Past Medical History, Past Surgical History, Family History and Social History were reviewed in [employer] record.     Objective:   Physical Exam BP 100/70   Pulse 83   Ht 5\' 2"  (1.575 m)   Wt 130 lb (59 kg)   BMI 23.78 kg/m  Gen: awake, alert, NAD HEENT: anicteric CV: RRR, no mrg Pulm: CTA b/l Abd: soft, NT/ND, +BS throughout Ext: no c/c/e Neuro: nonfocal     Assessment & Plan:  44 year old female with a history of resolved hepatitis C, GERD with dyspepsia associated with epigastric pain, history of IDA who is here for follow-up.   1.  GERD/dyspepsia --stable without alarm symptoms.  I recommended we try to reduce the dose of pantoprazole from 40 mg to 20 mg daily.  I asked that she notify me if she has recurrent GERD or dyspeptic symptoms with reducing dose. --Reduce pantoprazole to 20 mg daily --Follow-up in 2 years, sooner if needed  2.  History of IDA --resolved, prior upper endoscopy and colonoscopy.  3.  Colon cancer screening --distal hyperplastic polyps only in 2017, repeat in 2027  20 minutes total spent today including patient facing time, coordination of care, reviewing medical history/procedures/pertinent radiology studies, and  documentation of the encounter.

## 2020-01-14 NOTE — Patient Instructions (Signed)
Decrease your pantoprazole 20 mg once daily.  Follow up with Dr Hilarie Fredrickson in 2 years.  Call our office should your heartburn or epigastric worsen with the decrease in your pantoprazole.  If you are age 46 or older, your body mass index should be between 23-30. Your Body mass index is 23.78 kg/m. If this is out of the aforementioned range listed, please consider follow up with your Primary Care Provider.  If you are age 7 or younger, your body mass index should be between 19-25. Your Body mass index is 23.78 kg/m. If this is out of the aformentioned range listed, please consider follow up with your Primary Care Provider.   Due to recent changes in healthcare laws, you may see the results of your imaging and laboratory studies on MyChart before your provider has had a chance to review them.  We understand that in some cases there may be results that are confusing or concerning to you. Not all laboratory results come back in the same time frame and the provider may be waiting for multiple results in order to interpret others.  Please give [place] 48 hours in order for your provider to thoroughly review all the results before contacting the office for clarification of your results.

## 2020-04-11 ENCOUNTER — Other Ambulatory Visit: Payer: Self-pay | Admitting: Internal Medicine

## 2021-07-05 ENCOUNTER — Other Ambulatory Visit: Payer: Self-pay | Admitting: Internal Medicine

## 2021-07-30 ENCOUNTER — Ambulatory Visit: Payer: BC Managed Care – PPO | Admitting: Physician Assistant

## 2021-08-03 NOTE — Progress Notes (Deleted)
? ? ?  08/03/2021 ?[address] Heidi Schneider ?[id number] ?03-Jun-1975 ? ? ?ASSESSMENT AND PLAN:  ?*** ?There are no diagnoses linked to this encounter. ? ? ?History of Present Illness:  ?46 y.o. female  with a past medical history of resolved hepatitis C, GERD with dyspepsia associated with epigastric pain, history of IDA and others listed below, known to Dr. Hilarie Fredrickson returns to clinic today for evaluation of GERD and refills. ? ?Last seen 12/2019 , pantoprazole was reduced to 20 mg. Normal EGD and colon with history of IDA in 2017, recall 2027.  ? ?Current Medications:  ? ? ? ? ? ? ?Current Outpatient Medications (Other):  ?  pantoprazole (PROTONIX) 20 MG tablet, TAKE 1 TABLET(20 MG) BY MOUTH DAILY ? ?Surgical History:  ?She  has no past surgical history on file. ?Family History:  ?Her family history is not on file. ?Social History:  ? reports that she has never smoked. She has never used smokeless tobacco. She reports that she does not drink alcohol and does not use drugs. ? ?Current Medications, Allergies, Past Medical History, Past Surgical History, Family History and Social History were reviewed in [employer] record. ? ?Physical Exam: ?There were no vitals taken for this visit. ?General:   Pleasant, well developed female in no acute distress ?Eyes: {sclerae:26738},conjunctive {conjuctiva:26739}  ?Heart:  {HEART EXAM HEM/ONC:21750} ?Pulm: Clear anteriorly; no wheezing ?Abdomen:  {BlankSingle:19197::"Distended","Ridged","Soft"}, {BlankSingle:19197::"Flat","Obese"} AB, skin exam {ABDOMEN SKIN EXAM:22649}, {BlankSingle:19197::"Absent","Hyperactive, tinkling","Hypoactive","Sluggish","Normal"} bowel sounds. {Desc; pc desc - abdomen tenderness:5168} tenderness {anatomy; site abdomen:5010}. {BlankMultiple:19196::"Without guarding","With guarding","Without rebound","With rebound"}, {Exam; abdomen organomegaly:15152}. ?Extremities:  {With/Without:304960234} edema. Peripheral pulses intact.  ?Neurologic:  Alert and   oriented x4;  grossly normal neurologically. ?Skin:   Dry and intact without significant lesions or rashes. ?Psychiatric: Demonstrates good judgement and reason without abnormal affect or behaviors. ? ?Vladimir Crofts, PA-C ?08/03/21 ?

## 2021-08-04 ENCOUNTER — Ambulatory Visit: Payer: No Typology Code available for payment source | Admitting: Physician Assistant

## 2021-08-05 ENCOUNTER — Telehealth (INDEPENDENT_AMBULATORY_CARE_PROVIDER_SITE_OTHER): Payer: No Typology Code available for payment source | Admitting: Gastroenterology

## 2021-08-05 ENCOUNTER — Encounter: Payer: Self-pay | Admitting: Gastroenterology

## 2021-08-05 ENCOUNTER — Telehealth: Payer: Self-pay | Admitting: Gastroenterology

## 2021-08-05 DIAGNOSIS — R1013 Epigastric pain: Secondary | ICD-10-CM | POA: Diagnosis not present

## 2021-08-05 DIAGNOSIS — K219 Gastro-esophageal reflux disease without esophagitis: Secondary | ICD-10-CM | POA: Diagnosis not present

## 2021-08-05 MED ORDER — PANTOPRAZOLE SODIUM 20 MG PO TBEC: 20.0000 mg | DELAYED_RELEASE_TABLET | Freq: Every day | ORAL | 3 refills | Status: DC

## 2021-08-05 NOTE — Progress Notes (Addendum)
? ? ? ?  08/05/2021 ?[address] Carylon Tamburro ?[id number] ?1976-04-27 ? ?I connected with Sung Amabile on 07/30/2021 at 10:00AM by video enabled telemedicine application and verified that I am speaking with the correct person.  The patient and the provider were at separate locations during the entire encounter.  I discussed the limitations of evaluation and management by telemedicine and the availability of in person appointments.  Patient expressed understanding and agreed to proceed.  The patient and provider were at different locations for the entirety of the visit.  The patient was at work and the provider was at [facility] gastroenterology office.  ? ? ?HISTORY OF PRESENT ILLNESS: This is a 46 year old female who is a patient of Dr. Vena Rua.  She was last seen here in August 2021.  She follows here for her symptoms of GERD and dyspepsia.  She had been on pantoprazole 40 mg daily, but at her last visit it was decreased to 20 mg daily.  She has continued on that dose since that time.  She continues to do well with that.  She has no symptoms.  No abdominal pain, dysphagia, heartburn or reflux.  She says that she simply needs her medication refilled. ? ? ?Past Medical History:  ?Diagnosis Date  ? Cholelithiasis   ? Elevated LFTs   ? GERD (gastroesophageal reflux disease)   ? Hepatitis C   ? resolved  ? Hyperplastic colon polyp   ? Iron deficiency anemia   ? ?No past surgical history on file. ? reports that she has never smoked. She has never used smokeless tobacco. She reports that she does not drink alcohol and does not use drugs. ?family history is not on file. ?No Known Allergies ? ?  ?Outpatient Encounter Medications as of 08/05/2021  ?Medication Sig  ? pantoprazole (PROTONIX) 20 MG tablet TAKE 1 TABLET(20 MG) BY MOUTH DAILY  ? ?No facility-administered encounter medications on file as of 08/05/2021.  ? ? ?REVIEW OF SYSTEMS  : All other systems reviewed and negative except where noted in the History of Present  Illness. ? ? ?PHYSICAL EXAM: ?There were no vitals taken for this visit. ?General: Well developed Asian female in no acute distress ? ?ASSESSMENT AND PLAN: ?*GERD/dyspepsia: Well-controlled on pantoprazole 20 mg daily.  She has been on this dose since her last visit in August 2021 and still has good control of her symptoms.  She will continue this.  Refill sent to her pharmacy.  Follow-up in 2 years for further refills or sooner if needed. ?*Colon cancer screening: Distal hyperplastic polyps only in 2017.  Surveillance repeat recommended in 2027. ? ? ?CC:  No ref. provider found ? ?  ?

## 2021-08-05 NOTE — Progress Notes (Signed)
Addendum: Reviewed and agree with assessment and management plan. Hani Patnode M, MD  

## 2021-08-05 NOTE — Telephone Encounter (Signed)
[name], palinkas are scheduled for a virtual visit with your provider today.   ? ?Just as we do with appointments in the office, we must obtain your consent to participate.  Your consent will be active for this visit and any virtual visit you may have with one of our providers in the next 365 days.   ? ?If you have a MyChart account, I can also send a copy of this consent to you electronically.  All virtual visits are billed to your insurance company just like a traditional visit in the office.  As this is a virtual visit, video technology does not allow for your provider to perform a traditional examination.  This may limit your provider's ability to fully assess your condition.  If your provider identifies any concerns that need to be evaluated in person or the need to arrange testing such as labs, EKG, etc, we will make arrangements to do so.   ? ?Although advances in technology are sophisticated, we cannot ensure that it will always work on either your end or our end.  If the connection with a video visit is poor, we may have to switch to a telephone visit.  With either a video or telephone visit, we are not always able to ensure that we have a secure connection.   I need to obtain your verbal consent now.   Are you willing to proceed with your visit today?  ? ?Heidi Schneider has provided verbal consent on 08/05/2021 for a virtual visit (video or telephone). ? ? ?Laban Emperor. Rodnesha Elie, PA-C ?08/05/2021  10:19 AM ?  ?

## 2021-08-05 NOTE — Patient Instructions (Signed)
Continue pantoprazole 20 mg daily.  Prescription sent to pharmacy.  Follow-up here in 2 years or sooner if needed. ?

## 2021-12-10 ENCOUNTER — Other Ambulatory Visit: Payer: Self-pay | Admitting: Family Medicine

## 2021-12-10 DIAGNOSIS — Z1231 Encounter for screening mammogram for malignant neoplasm of breast: Secondary | ICD-10-CM

## 2021-12-11 ENCOUNTER — Telehealth: Payer: Self-pay | Admitting: Hematology and Oncology

## 2021-12-11 NOTE — Telephone Encounter (Signed)
Scheduled appt per 7/14 referral. Pt is aware of appt date and time. Pt is aware to arrive 15 mins prior to appt time and to bring and updated insurance card. Pt is aware of appt location.   

## 2021-12-22 ENCOUNTER — Ambulatory Visit
Admission: RE | Admit: 2021-12-22 | Discharge: 2021-12-22 | Disposition: A | Discharge status: Home / Self Care | Payer: No Typology Code available for payment source | Source: Ambulatory Visit | Attending: Family Medicine | Admitting: Family Medicine

## 2021-12-22 DIAGNOSIS — Z1231 Encounter for screening mammogram for malignant neoplasm of breast: Secondary | ICD-10-CM

## 2021-12-30 ENCOUNTER — Other Ambulatory Visit: Payer: Self-pay

## 2021-12-30 ENCOUNTER — Inpatient Hospital Stay: Payer: No Typology Code available for payment source

## 2021-12-30 ENCOUNTER — Encounter: Payer: Self-pay | Admitting: Hematology and Oncology

## 2021-12-30 ENCOUNTER — Inpatient Hospital Stay
Payer: No Typology Code available for payment source | Attending: Hematology and Oncology | Admitting: Hematology and Oncology

## 2021-12-30 VITALS — BP 127/88 | HR 92 | Temp 98.4°F | Resp 18 | Ht 62.0 in | Wt 133.4 lb

## 2021-12-30 DIAGNOSIS — D75839 Thrombocytosis, unspecified: Secondary | ICD-10-CM | POA: Insufficient documentation

## 2021-12-30 DIAGNOSIS — Z79899 Other long term (current) drug therapy: Secondary | ICD-10-CM | POA: Insufficient documentation

## 2021-12-30 DIAGNOSIS — K219 Gastro-esophageal reflux disease without esophagitis: Secondary | ICD-10-CM | POA: Diagnosis not present

## 2021-12-30 DIAGNOSIS — Z86010 Personal history of colonic polyps: Secondary | ICD-10-CM | POA: Insufficient documentation

## 2021-12-30 DIAGNOSIS — R948 Abnormal results of function studies of other organs and systems: Secondary | ICD-10-CM | POA: Diagnosis not present

## 2021-12-30 DIAGNOSIS — D509 Iron deficiency anemia, unspecified: Secondary | ICD-10-CM | POA: Diagnosis present

## 2021-12-30 DIAGNOSIS — K8020 Calculus of gallbladder without cholecystitis without obstruction: Secondary | ICD-10-CM | POA: Diagnosis not present

## 2021-12-30 LAB — CBC WITH DIFFERENTIAL/PLATELET
Abs Immature Granulocytes: 0.02 10*3/uL (ref 0.00–0.07)
Basophils Absolute: 0 10*3/uL (ref 0.0–0.1)
Basophils Relative: 1 %
Eosinophils Absolute: 0 10*3/uL (ref 0.0–0.5)
Eosinophils Relative: 1 %
HCT: 29.7 % — ABNORMAL LOW (ref 36.0–46.0)
Hemoglobin: 8.2 g/dL — ABNORMAL LOW (ref 12.0–15.0)
Immature Granulocytes: 1 %
Lymphocytes Relative: 27 %
Lymphs Abs: 1.2 10*3/uL (ref 0.7–4.0)
MCH: 17.8 pg — ABNORMAL LOW (ref 26.0–34.0)
MCHC: 27.6 g/dL — ABNORMAL LOW (ref 30.0–36.0)
MCV: 64.6 fL — ABNORMAL LOW (ref 80.0–100.0)
Monocytes Absolute: 0.4 10*3/uL (ref 0.1–1.0)
Monocytes Relative: 10 %
Neutro Abs: 2.6 10*3/uL (ref 1.7–7.7)
Neutrophils Relative %: 60 %
Platelets: 356 10*3/uL (ref 150–400)
RBC: 4.6 MIL/uL (ref 3.87–5.11)
RDW: 26.5 % — ABNORMAL HIGH (ref 11.5–15.5)
Smear Review: NORMAL
WBC: 4.2 10*3/uL (ref 4.0–10.5)
nRBC: 0 % (ref 0.0–0.2)

## 2021-12-30 LAB — IRON AND IRON BINDING CAPACITY (CC-WL,HP ONLY)
Iron: 331 ug/dL — ABNORMAL HIGH (ref 28–170)
Saturation Ratios: 67 % — ABNORMAL HIGH (ref 10.4–31.8)
TIBC: 497 ug/dL — ABNORMAL HIGH (ref 250–450)
UIBC: 166 ug/dL

## 2021-12-30 NOTE — Assessment & Plan Note (Signed)
This is a pleasant 46 year old female patient with microcytic hypochromic severe anemia hemoglobin was 8.6 on her most recent labs referred to hematology for additional recommendations.  Patient denies any new health complaints.  She feels very well.  She denies any anemia related complications in the past.  Physical examination is unremarkable, no palpable lymphadenopathy or hepatosplenomegaly.  I have reviewed labs which shows severe microcytic hypochromic anemia with mild thrombocytosis.  I have recommended doing CBC, iron panel, ferritin and hemoglobin electrophoresis.  We have discussed common causes of microcytic anemia such as thalassemia and iron deficiency given she being completely asymptomatic.   She will return to clinic in about 4 weeks to review labs and to discuss any additional recommendations.  Age-appropriate cancer screening advised.  She is due for mammogram.  Last colonoscopy in 2017.

## 2021-12-30 NOTE — Progress Notes (Signed)
[facility] CONSULT NOTE  Patient Care Team: Patient, No Pcp Per as PCP - General (General Practice)  CHIEF COMPLAINTS/PURPOSE OF CONSULTATION:  Microcytic hypochromic anemia  ASSESSMENT & PLAN:   Microcytic hypochromic anemia This is a pleasant 46 year old female patient with microcytic hypochromic severe anemia hemoglobin was 8.6 on her most recent labs referred to hematology for additional recommendations.  Patient denies any new health complaints.  She feels very well.  She denies any anemia related complications in the past.  Physical examination is unremarkable, no palpable lymphadenopathy or hepatosplenomegaly.  I have reviewed labs which shows severe microcytic hypochromic anemia with mild thrombocytosis.  I have recommended doing CBC, iron panel, ferritin and hemoglobin electrophoresis.  We have discussed common causes of microcytic anemia such as thalassemia and iron deficiency given she being completely asymptomatic.   She will return to clinic in about 4 weeks to review labs and to discuss any additional recommendations.  Age-appropriate cancer screening advised.  She is due for mammogram.  Last colonoscopy in 2017.  Orders Placed This Encounter  Procedures   CBC with Differential/Platelet    Standing Status:   Standing    Number of Occurrences:   22    Standing Expiration Date:   12/31/2022   Iron and Iron Binding Capacity ([facility] only)    Standing Status:   Future    Number of Occurrences:   1    Standing Expiration Date:   12/31/2022   Ferritin    Standing Status:   Future    Number of Occurrences:   1    Standing Expiration Date:   12/30/2022   Hgb Fractionation Cascade    Standing Status:   Future    Number of Occurrences:   1    Standing Expiration Date:   12/31/2022     HISTORY OF PRESENTING ILLNESS:   This is a very pleasant 46 year old [place] female patient with past medical history significant for iron deficiency anemia referred to hematology  for additional recommendations.  Patient absolutely denies any health issues.  She feels well, but stays very active.  She denies any unusual menstrual bleeding or gastrointestinal bleeding.  She had a colonoscopy back in 2017 which was unremarkable.  She has 2 kids, youngest is 50 years old and she denies any complications related to anemia during her pregnancy.  No family history of hematological disorders.  No known underlying thalassemia. Rest of the pertinent 10 point ROS reviewed and negative.  REVIEW OF SYSTEMS:   Constitutional: Denies fevers, chills or abnormal night sweats Eyes: Denies blurriness of vision, double vision or watery eyes Ears, nose, mouth, throat, and face: Denies mucositis or sore throat Respiratory: Denies cough, dyspnea or wheezes Cardiovascular: Denies palpitation, chest discomfort or lower extremity swelling Gastrointestinal:  Denies nausea, heartburn or change in bowel habits Skin: Denies abnormal skin rashes Lymphatics: Denies new lymphadenopathy or easy bruising Neurological:Denies numbness, tingling or new weaknesses Behavioral/Psych: Mood is stable, no new changes  All other systems were reviewed with the patient and are negative.  MEDICAL HISTORY:  Past Medical History:  Diagnosis Date   Cholelithiasis    Elevated LFTs    GERD (gastroesophageal reflux disease)    Hepatitis C    resolved   Hyperplastic colon polyp    Iron deficiency anemia     SURGICAL HISTORY: History reviewed. No pertinent surgical history.  SOCIAL HISTORY: Social History   Socioeconomic History   Marital status: Married    Spouse name: Not on  file   Number of children: 2   Years of education: Not on file   Highest education level: Not on file  Occupational History   Occupation: Programmer  Tobacco Use   Smoking status: Never   Smokeless tobacco: Never  Vaping Use   Vaping Use: Never used  Substance and Sexual Activity   Alcohol use: No    Alcohol/week: 0.0  standard drinks of alcohol   Drug use: No   Sexual activity: Yes    Birth control/protection: None  Other Topics Concern   Not on file  Social History Narrative   Not on file   Social Determinants of Health   Financial Resource Strain: Not on file  Food Insecurity: Not on file  Transportation Needs: Not on file  Physical Activity: Not on file  Stress: Not on file  Social Connections: Not on file  Intimate Partner Violence: Not on file    FAMILY HISTORY: Family History  Problem Relation Age of Onset   Colon cancer Neg Hx    Stomach cancer Neg Hx    Colon polyps Neg Hx    Esophageal cancer Neg Hx    Rectal cancer Neg Hx     ALLERGIES:  has No Known Allergies.  MEDICATIONS:  Current Outpatient Medications  Medication Sig Dispense Refill   pantoprazole (PROTONIX) 20 MG tablet Take 1 tablet (20 mg total) by mouth daily. 90 tablet 3   No current facility-administered medications for this visit.     PHYSICAL EXAMINATION: ECOG PERFORMANCE STATUS: 0 - Asymptomatic  Vitals:   12/30/21 1407  BP: 127/88  Pulse: 92  Resp: 18  Temp: 98.4 F (36.9 C)  SpO2: 100%   Filed Weights   12/30/21 1407  Weight: 133 lb 6.4 oz (60.5 kg)    GENERAL:alert, no distress and comfortable SKIN: skin color, texture, turgor are normal, no rashes or significant lesions EYES: normal, conjunctiva are pink and non-injected, sclera clear OROPHARYNX:no exudate, no erythema and lips, buccal mucosa, and tongue normal  NECK: supple, thyroid normal size, non-tender, without nodularity LYMPH:  no palpable lymphadenopathy in the cervical, axillary or inguinal LUNGS: clear to auscultation and percussion with normal breathing effort HEART: regular rate & rhythm and no murmurs and no lower extremity edema ABDOMEN:abdomen soft, non-tender and normal bowel sounds Musculoskeletal:no cyanosis of digits and no clubbing  PSYCH: alert & oriented x 3 with fluent speech NEURO: no focal motor/sensory  deficits  LABORATORY DATA:  I have reviewed the data as listed Lab Results  Component Value Date   WBC 5.3 09/29/2016   HGB 12.2 09/29/2016   HCT 37.4 09/29/2016   MCV 91.2 09/29/2016   PLT 228.0 09/29/2016     Chemistry      Component Value Date/Time   NA 139 03/17/2016 1611   K 4.2 03/17/2016 1611   CL 105 03/17/2016 1611   CO2 27 03/17/2016 1611   BUN 6 03/17/2016 1611   CREATININE 0.71 03/17/2016 1611   CREATININE 0.51 05/01/2014 1835      Component Value Date/Time   CALCIUM 9.3 03/17/2016 1611   ALKPHOS 145 (H) 03/17/2016 1611   AST 18 03/17/2016 1611   ALT 53 (H) 03/17/2016 1611   BILITOT 0.4 03/17/2016 1611     Most recent CBC showed a hemoglobin of 8.6 g/dL, microcytic and hypochromic.  Normal white blood cell count and platelet count.  CMP is normal.  TSH is low  RADIOGRAPHIC STUDIES: I have personally reviewed the radiological images as listed and  agreed with the findings in the report. MM 3D SCREEN BREAST BILATERAL  Result Date: 12/24/2021 CLINICAL DATA:  Screening. EXAM: DIGITAL SCREENING BILATERAL MAMMOGRAM WITH TOMOSYNTHESIS AND CAD TECHNIQUE: Bilateral screening digital craniocaudal and mediolateral oblique mammograms were obtained. Bilateral screening digital breast tomosynthesis was performed. The images were evaluated with computer-aided detection. COMPARISON:  Previous exam(s). ACR Breast Density Category d: The breast tissue is extremely dense, which lowers the sensitivity of mammography FINDINGS: There are no findings suspicious for malignancy. IMPRESSION: No mammographic evidence of malignancy. A result letter of this screening mammogram will be mailed directly to the patient. RECOMMENDATION: Screening mammogram in one year. (Code:SM-B-01Y) BI-RADS CATEGORY  1: Negative. Electronically Signed   By: Lajean Manes M.D.   On: 12/24/2021 08:20    All questions were answered. The patient knows to call the clinic with any problems, questions or concerns. I  spent 30 minutes in the care of this patient including H and P, review of records, counseling and coordination of care.     Benay Pike, MD 12/30/2021 2:46 PM

## 2021-12-31 LAB — FERRITIN: Ferritin: 4 ng/mL — ABNORMAL LOW (ref 11–307)

## 2022-01-01 ENCOUNTER — Other Ambulatory Visit: Payer: Self-pay | Admitting: Hematology and Oncology

## 2022-01-01 ENCOUNTER — Telehealth: Payer: Self-pay | Admitting: *Deleted

## 2022-01-01 LAB — HGB FRACTIONATION CASCADE
Hgb A2: 2.2 % (ref 1.8–3.2)
Hgb A: 97.8 % (ref 96.4–98.8)
Hgb F: 0 % (ref 0.0–2.0)
Hgb S: 0 %

## 2022-01-01 NOTE — Progress Notes (Signed)
Patient agreeable to intravenous iron.  Iron orders placed.

## 2022-01-01 NOTE — Telephone Encounter (Signed)
This RN spoke with pt per call regarding need for IV iron replacement.  Procedure of how IV iron is administed ( 5 treatments can be once or twice a week per her schedule) , side effects including possible sensitivity while receiving and benefits explained with pt in agreement to plan.  This note will be sent to MD for treatment plan for scheduling.  Pt understands she will be receiving call from scheduling per above.

## 2022-01-01 NOTE — Telephone Encounter (Signed)
-----   Message from Benay Pike, MD sent at 12/31/2021  5:25 PM EDT ----- Severe IDA noted. Recommend IV iron. Called patient, no answer, left a voicemail about the details of iron supplementation. Can you call her tomorrow and let me know what she says.

## 2022-01-04 ENCOUNTER — Telehealth: Payer: Self-pay | Admitting: Hematology and Oncology

## 2022-01-04 NOTE — Telephone Encounter (Signed)
Contacted patient to scheduled appointments. Patient is aware of appointments that are scheduled.   

## 2022-01-08 ENCOUNTER — Other Ambulatory Visit: Payer: Self-pay

## 2022-01-08 ENCOUNTER — Inpatient Hospital Stay: Payer: No Typology Code available for payment source

## 2022-01-08 VITALS — BP 123/69 | HR 77 | Temp 98.1°F | Resp 18

## 2022-01-08 DIAGNOSIS — D509 Iron deficiency anemia, unspecified: Secondary | ICD-10-CM

## 2022-01-08 MED ORDER — SODIUM CHLORIDE 0.9 % IV SOLN: Freq: Once | INTRAVENOUS | Status: AC

## 2022-01-08 MED ORDER — SODIUM CHLORIDE 0.9 % IV SOLN
200.0000 mg | Freq: Once | INTRAVENOUS | Status: AC
  Administered 2022-01-08: 200 mg via INTRAVENOUS
  Filled 2022-01-08: qty 200

## 2022-01-08 NOTE — Progress Notes (Signed)
Tolerated first iron treatment well and stayed 30 minute observation period. VSS and no signs of distress noted upon discharge.

## 2022-01-08 NOTE — Patient Instructions (Signed)

## 2022-01-11 ENCOUNTER — Inpatient Hospital Stay: Payer: No Typology Code available for payment source

## 2022-01-11 ENCOUNTER — Other Ambulatory Visit: Payer: Self-pay

## 2022-01-11 VITALS — BP 103/67 | HR 64 | Temp 99.0°F | Resp 17

## 2022-01-11 DIAGNOSIS — D509 Iron deficiency anemia, unspecified: Secondary | ICD-10-CM

## 2022-01-11 MED ORDER — SODIUM CHLORIDE 0.9 % IV SOLN: Freq: Once | INTRAVENOUS | Status: AC

## 2022-01-11 MED ORDER — SODIUM CHLORIDE 0.9 % IV SOLN
200.0000 mg | Freq: Once | INTRAVENOUS | Status: AC
  Administered 2022-01-11: 200 mg via INTRAVENOUS
  Filled 2022-01-11: qty 200

## 2022-01-11 NOTE — Progress Notes (Signed)
Patient stayed for 30 minute post-iron observation. Vitals stable, ambulatory to lobby.

## 2022-01-11 NOTE — Patient Instructions (Signed)

## 2022-01-13 ENCOUNTER — Inpatient Hospital Stay: Payer: No Typology Code available for payment source

## 2022-01-13 ENCOUNTER — Other Ambulatory Visit: Payer: Self-pay

## 2022-01-13 VITALS — BP 111/70 | HR 73 | Temp 98.3°F | Resp 18

## 2022-01-13 DIAGNOSIS — D509 Iron deficiency anemia, unspecified: Secondary | ICD-10-CM | POA: Diagnosis not present

## 2022-01-13 MED ORDER — SODIUM CHLORIDE 0.9 % IV SOLN: Freq: Once | INTRAVENOUS | Status: AC

## 2022-01-13 MED ORDER — SODIUM CHLORIDE 0.9 % IV SOLN
200.0000 mg | Freq: Once | INTRAVENOUS | Status: AC
  Administered 2022-01-13: 200 mg via INTRAVENOUS
  Filled 2022-01-13: qty 200

## 2022-01-13 NOTE — Patient Instructions (Signed)
[facility] MEDICAL ONCOLOGY  Discharge Instructions: Thank you for choosing [facility] to provide your oncology and hematology care.   If you have a lab appointment with the Cancer Center, please go directly to the Cancer Center and check in at the registration area.   Wear comfortable clothing and clothing appropriate for easy access to any Portacath or PICC line.   We strive to give you quality time with your provider. You may need to reschedule your appointment if you arrive late (15 or more minutes).  Arriving late affects you and other patients whose appointments are after yours.  Also, if you miss three or more appointments without notifying the office, you may be dismissed from the clinic at the provider's discretion.      For prescription refill requests, have your pharmacy contact our office and allow 72 hours for refills to be completed.    Today you received the following chemotherapy and/or immunotherapy agents: Venofer.       To help prevent nausea and vomiting after your treatment, we encourage you to take your nausea medication as directed.  BELOW ARE SYMPTOMS THAT SHOULD BE REPORTED IMMEDIATELY: *FEVER GREATER THAN 100.4 F (38 C) OR HIGHER *CHILLS OR SWEATING *NAUSEA AND VOMITING THAT IS NOT CONTROLLED WITH YOUR NAUSEA MEDICATION *UNUSUAL SHORTNESS OF BREATH *UNUSUAL BRUISING OR BLEEDING *URINARY PROBLEMS (pain or burning when urinating, or frequent urination) *BOWEL PROBLEMS (unusual diarrhea, constipation, pain near the anus) TENDERNESS IN MOUTH AND THROAT WITH OR WITHOUT PRESENCE OF ULCERS (sore throat, sores in mouth, or a toothache) UNUSUAL RASH, SWELLING OR PAIN  UNUSUAL VAGINAL DISCHARGE OR ITCHING   Items with * indicate a potential emergency and should be followed up as soon as possible or go to the Emergency Department if any problems should occur.  Please show the CHEMOTHERAPY ALERT CARD or IMMUNOTHERAPY ALERT CARD at check-in to  the Emergency Department and triage nurse.  Should you have questions after your visit or need to cancel or reschedule your appointment, please contact [facility]ICAL [facility]COLOG[facility]  Dept: [phone]  and follow the prompts.  Office hours are 8:00 a.m. to 4:30 p.m. Monday - Friday. Please note that voicemails left after 4:00 p.m. may not be returned until the following business day.  We are closed weekends and major holidays. You have access to a nurse at all times for urgent questions. Please call the main number to the clinic Dept: [phone] and follow the prompts.   For any non-urgent questions, you may also contact your provider using MyChart. We now offer e-Visits for anyone 18 and older to request care online for non-urgent symptoms. For details visit [email or web address].   Also download the MyChart app! Go to the app store, search "MyChart", open the app, select [facility], and log in with your MyChart username and password.  Masks are optional in the cancer centers. If you would like for your care team to wear a mask while they are taking care of you, please let them know. You may have one support person who is at least 48 years old accompany you for your appointments. 

## 2022-01-15 ENCOUNTER — Inpatient Hospital Stay: Payer: No Typology Code available for payment source

## 2022-01-15 ENCOUNTER — Other Ambulatory Visit: Payer: Self-pay

## 2022-01-15 VITALS — BP 110/75 | HR 81 | Temp 98.5°F | Resp 16

## 2022-01-15 DIAGNOSIS — D509 Iron deficiency anemia, unspecified: Secondary | ICD-10-CM

## 2022-01-15 MED ORDER — SODIUM CHLORIDE 0.9 % IV SOLN
200.0000 mg | Freq: Once | INTRAVENOUS | Status: AC
  Administered 2022-01-15: 200 mg via INTRAVENOUS
  Filled 2022-01-15: qty 200

## 2022-01-15 MED ORDER — SODIUM CHLORIDE 0.9 % IV SOLN: Freq: Once | INTRAVENOUS | Status: AC

## 2022-01-15 NOTE — Patient Instructions (Signed)

## 2022-01-18 ENCOUNTER — Other Ambulatory Visit: Payer: Self-pay

## 2022-01-18 ENCOUNTER — Inpatient Hospital Stay: Payer: No Typology Code available for payment source

## 2022-01-18 VITALS — BP 148/82 | HR 80 | Temp 98.0°F | Resp 18

## 2022-01-18 DIAGNOSIS — D509 Iron deficiency anemia, unspecified: Secondary | ICD-10-CM

## 2022-01-18 MED ORDER — SODIUM CHLORIDE 0.9 % IV SOLN: Freq: Once | INTRAVENOUS | Status: AC

## 2022-01-18 MED ORDER — SODIUM CHLORIDE 0.9 % IV SOLN
200.0000 mg | Freq: Once | INTRAVENOUS | Status: AC
  Administered 2022-01-18: 200 mg via INTRAVENOUS
  Filled 2022-01-18: qty 200

## 2022-01-18 NOTE — Patient Instructions (Signed)

## 2022-01-28 ENCOUNTER — Encounter: Payer: Self-pay | Admitting: Hematology and Oncology

## 2022-01-28 ENCOUNTER — Inpatient Hospital Stay: Payer: No Typology Code available for payment source

## 2022-01-28 ENCOUNTER — Inpatient Hospital Stay (HOSPITAL_BASED_OUTPATIENT_CLINIC_OR_DEPARTMENT_OTHER): Payer: No Typology Code available for payment source | Admitting: Hematology and Oncology

## 2022-01-28 ENCOUNTER — Other Ambulatory Visit: Payer: Self-pay

## 2022-01-28 VITALS — BP 107/81 | HR 85 | Temp 98.1°F | Resp 16 | Ht 62.0 in | Wt 132.5 lb

## 2022-01-28 DIAGNOSIS — D509 Iron deficiency anemia, unspecified: Secondary | ICD-10-CM | POA: Diagnosis not present

## 2022-01-28 LAB — CBC WITH DIFFERENTIAL/PLATELET
Abs Immature Granulocytes: 0.01 10*3/uL (ref 0.00–0.07)
Basophils Absolute: 0 10*3/uL (ref 0.0–0.1)
Basophils Relative: 0 %
Eosinophils Absolute: 0.1 10*3/uL (ref 0.0–0.5)
Eosinophils Relative: 1 %
HCT: 36.7 % (ref 36.0–46.0)
Hemoglobin: 11.6 g/dL — ABNORMAL LOW (ref 12.0–15.0)
Immature Granulocytes: 0 %
Lymphocytes Relative: 33 %
Lymphs Abs: 1.8 10*3/uL (ref 0.7–4.0)
MCH: 23.1 pg — ABNORMAL LOW (ref 26.0–34.0)
MCHC: 31.6 g/dL (ref 30.0–36.0)
MCV: 73 fL — ABNORMAL LOW (ref 80.0–100.0)
Monocytes Absolute: 0.5 10*3/uL (ref 0.1–1.0)
Monocytes Relative: 8 %
Neutro Abs: 3.1 10*3/uL (ref 1.7–7.7)
Neutrophils Relative %: 58 %
Platelets: 285 10*3/uL (ref 150–400)
RBC: 5.03 MIL/uL (ref 3.87–5.11)
WBC: 5.5 10*3/uL (ref 4.0–10.5)
nRBC: 0 % (ref 0.0–0.2)

## 2022-01-28 LAB — IRON AND IRON BINDING CAPACITY (CC-WL,HP ONLY)
Iron: 71 ug/dL (ref 28–170)
Saturation Ratios: 20 % (ref 10.4–31.8)
TIBC: 354 ug/dL (ref 250–450)
UIBC: 283 ug/dL (ref 148–442)

## 2022-01-28 NOTE — Progress Notes (Signed)
[facility] CONSULT NOTE  Patient Care Team: Patient, No Pcp Per as PCP - General (General Practice)  CHIEF COMPLAINTS/PURPOSE OF CONSULTATION:  Microcytic hypochromic anemia  ASSESSMENT & PLAN:   Microcytic hypochromic anemia This is a pleasant 46 year old female patient with microcytic hypochromic severe anemia hemoglobin was 8.6 on her most recent labs referred to hematology for additional recommendations.   She is here for follow up. She has tolerated iron infusion very well. She denies any new complaints.  PE unremarkable, no concerns Labs today showed correction of anemia. Ferritin pending. Since she is clinically asymptomatic with correction of anemia, she will RTC in 6 months with repeat labs.   Orders Placed This Encounter  Procedures   CBC with Differential/Platelet    Standing Status:   Standing    Number of Occurrences:   22    Standing Expiration Date:   01/29/2023   Iron and Iron Binding Capacity ([facility] only)    Standing Status:   Future    Number of Occurrences:   1    Standing Expiration Date:   01/29/2023   Ferritin    Standing Status:   Future    Number of Occurrences:   1    Standing Expiration Date:   01/28/2023     HISTORY OF PRESENTING ILLNESS:   This is a very pleasant 46 year old [place] female patient with past medical history significant for iron deficiency anemia referred to hematology for additional recommendations.   Since last visit, she had iron infusion.  She tolerated this very well.  She denies any change.  She overall feels great even when her hemoglobin was 8 g/dL. Rest of the pertinent 10 point ROS reviewed and negative.  MEDICAL HISTORY:  Past Medical History:  Diagnosis Date   Cholelithiasis    Elevated LFTs    GERD (gastroesophageal reflux disease)    Hepatitis C    resolved   Hyperplastic colon polyp    Iron deficiency anemia     SURGICAL HISTORY: No past surgical history on file.  SOCIAL  HISTORY: Social History   Socioeconomic History   Marital status: Married    Spouse name: Not on file   Number of children: 2   Years of education: Not on file   Highest education level: Not on file  Occupational History   Occupation: Scientist, research (physical sciences)  Tobacco Use   Smoking status: Never   Smokeless tobacco: Never  Vaping Use   Vaping Use: Never used  Substance and Sexual Activity   Alcohol use: No    Alcohol/week: 0.0 standard drinks of alcohol   Drug use: No   Sexual activity: Yes    Birth control/protection: None  Other Topics Concern   Not on file  Social History Narrative   Not on file   Social Determinants of Health   Financial Resource Strain: Not on file  Food Insecurity: Not on file  Transportation Needs: Not on file  Physical Activity: Not on file  Stress: Not on file  Social Connections: Not on file  Intimate Partner Violence: Not on file    FAMILY HISTORY: Family History  Problem Relation Age of Onset   Colon cancer Neg Hx    Stomach cancer Neg Hx    Colon polyps Neg Hx    Esophageal cancer Neg Hx    Rectal cancer Neg Hx     ALLERGIES:  has No Known Allergies.  MEDICATIONS:  Current Outpatient Medications  Medication Sig Dispense Refill   pantoprazole (PROTONIX)  20 MG tablet Take 1 tablet (20 mg total) by mouth daily. 90 tablet 3   No current facility-administered medications for this visit.     PHYSICAL EXAMINATION: ECOG PERFORMANCE STATUS: 0 - Asymptomatic  Vitals:   01/28/22 1448  BP: 107/81  Pulse: 85  Resp: 16  Temp: 98.1 F (36.7 C)  SpO2: 100%   Filed Weights   01/28/22 1448  Weight: 132 lb 8 oz (60.1 kg)   Physical Exam Constitutional:      Appearance: Normal appearance.  HENT:     Head: Normocephalic and atraumatic.  Cardiovascular:     Rate and Rhythm: Normal rate and regular rhythm.     Pulses: Normal pulses.     Heart sounds: Normal heart sounds.  Musculoskeletal:     Cervical back: Normal range of motion and neck  supple. No rigidity.  Lymphadenopathy:     Cervical: No cervical adenopathy.  Neurological:     Mental Status: She is alert.      LABORATORY DATA:  I have reviewed the data as listed Lab Results  Component Value Date   WBC 5.5 01/28/2022   HGB 11.6 (L) 01/28/2022   HCT 36.7 01/28/2022   MCV 73.0 (L) 01/28/2022   PLT 285 01/28/2022     Chemistry      Component Value Date/Time   NA 139 03/17/2016 1611   K 4.2 03/17/2016 1611   CL 105 03/17/2016 1611   CO2 27 03/17/2016 1611   BUN 6 03/17/2016 1611   CREATININE 0.71 03/17/2016 1611   CREATININE 0.51 05/01/2014 1835      Component Value Date/Time   CALCIUM 9.3 03/17/2016 1611   ALKPHOS 145 (H) 03/17/2016 1611   AST 18 03/17/2016 1611   ALT 53 (H) 03/17/2016 1611   BILITOT 0.4 03/17/2016 1611     Labs from today showed correction of hemoglobin. Iron and iron binding panel normal Ferritin pending.  RADIOGRAPHIC STUDIES: I have personally reviewed the radiological images as listed and agreed with the findings in the report. No results found.  All questions were answered. The patient knows to call the clinic with any problems, questions or concerns. I spent 20 minutes in the care of this patient including H and P, review of records, counseling and coordination of care.     Benay Pike, MD 01/28/2022 5:27 PM

## 2022-01-28 NOTE — Assessment & Plan Note (Signed)
This is a pleasant 46 year old female patient with microcytic hypochromic severe anemia hemoglobin was 8.6 on her most recent labs referred to hematology for additional recommendations.   She is here for follow up. She has tolerated iron infusion very well. She denies any new complaints.  PE unremarkable, no concerns Labs today showed correction of anemia. Ferritin pending. Since she is clinically asymptomatic with correction of anemia, she will RTC in 6 months with repeat labs.

## 2022-01-29 LAB — FERRITIN: Ferritin: 113 ng/mL (ref 11–307)

## 2022-07-29 ENCOUNTER — Inpatient Hospital Stay
Payer: No Typology Code available for payment source | Attending: Hematology and Oncology | Admitting: Hematology and Oncology

## 2022-07-29 ENCOUNTER — Other Ambulatory Visit: Payer: Self-pay

## 2022-07-29 ENCOUNTER — Inpatient Hospital Stay: Payer: No Typology Code available for payment source

## 2022-07-29 VITALS — BP 125/93 | HR 85 | Temp 98.2°F | Resp 16 | Ht 62.0 in | Wt 127.5 lb

## 2022-07-29 DIAGNOSIS — D509 Iron deficiency anemia, unspecified: Secondary | ICD-10-CM

## 2022-07-29 LAB — CBC WITH DIFFERENTIAL/PLATELET
Abs Immature Granulocytes: 0.01 10*3/uL (ref 0.00–0.07)
Basophils Absolute: 0 10*3/uL (ref 0.0–0.1)
Basophils Relative: 1 %
Eosinophils Absolute: 0.1 10*3/uL (ref 0.0–0.5)
Eosinophils Relative: 1 %
HCT: 35 % — ABNORMAL LOW (ref 36.0–46.0)
Hemoglobin: 11.6 g/dL — ABNORMAL LOW (ref 12.0–15.0)
Immature Granulocytes: 0 %
Lymphocytes Relative: 34 %
Lymphs Abs: 1.6 10*3/uL (ref 0.7–4.0)
MCH: 28.5 pg (ref 26.0–34.0)
MCHC: 33.1 g/dL (ref 30.0–36.0)
MCV: 86 fL (ref 80.0–100.0)
Monocytes Absolute: 0.3 10*3/uL (ref 0.1–1.0)
Monocytes Relative: 7 %
Neutro Abs: 2.8 10*3/uL (ref 1.7–7.7)
Neutrophils Relative %: 57 %
Platelets: 291 10*3/uL (ref 150–400)
RBC: 4.07 MIL/uL (ref 3.87–5.11)
RDW: 14.2 % (ref 11.5–15.5)
WBC: 4.8 10*3/uL (ref 4.0–10.5)
nRBC: 0 % (ref 0.0–0.2)

## 2022-07-29 LAB — IRON AND IRON BINDING CAPACITY (CC-WL,HP ONLY)
Iron: 162 ug/dL (ref 28–170)
Saturation Ratios: 36 % — ABNORMAL HIGH (ref 10.4–31.8)
TIBC: 449 ug/dL (ref 250–450)
UIBC: 287 ug/dL

## 2022-07-29 NOTE — Progress Notes (Signed)
[facility] CONSULT NOTE  Patient Care Team: Patient, No Pcp Per as PCP - General (General Practice)  CHIEF COMPLAINTS/PURPOSE OF CONSULTATION:  Microcytic hypochromic anemia  ASSESSMENT & PLAN:   This is a very pleasant 47 year old female patient with iron deficiency anemia referred to hematology for evaluation and recommendations.  When she presented, she was noticed to have severe anemia hence we gave her IV iron which she tolerated remarkably well.  She is here for follow-up.  She continues to have regular menstrual cycles but otherwise denies any hematochezia or melena.  No concerning review of systems or physical exam findings.  Will repeat CBC, iron panel and ferritin today.  If these are unremarkable, she can return to clinic in 1 year or sooner as needed.  HISTORY OF PRESENTING ILLNESS:   This is a very pleasant 47 year old [place] female patient with past medical history significant for iron deficiency anemia referred to hematology for additional recommendations.   She is here for follow-up.  She continues to do really well.  She denies any complaints at all. She has regular menstrual cycles.  No other bleeding including hematochezia or melena. Rest of the pertinent 10 point ROS reviewed and negative.  MEDICAL HISTORY:  Past Medical History:  Diagnosis Date   Cholelithiasis    Elevated LFTs    GERD (gastroesophageal reflux disease)    Hepatitis C    resolved   Hyperplastic colon polyp    Iron deficiency anemia     SURGICAL HISTORY: No past surgical history on file.  SOCIAL HISTORY: Social History   Socioeconomic History   Marital status: Married    Spouse name: Not on file   Number of children: 2   Years of education: Not on file   Highest education level: Not on file  Occupational History   Occupation: Scientist, research (physical sciences)  Tobacco Use   Smoking status: Never   Smokeless tobacco: Never  Vaping Use   Vaping Use: Never used  Substance and Sexual  Activity   Alcohol use: No    Alcohol/week: 0.0 standard drinks of alcohol   Drug use: No   Sexual activity: Yes    Birth control/protection: None  Other Topics Concern   Not on file  Social History Narrative   Not on file   Social Determinants of Health   Financial Resource Strain: Not on file  Food Insecurity: Not on file  Transportation Needs: Not on file  Physical Activity: Not on file  Stress: Not on file  Social Connections: Not on file  Intimate Partner Violence: Not on file    FAMILY HISTORY: Family History  Problem Relation Age of Onset   Colon cancer Neg Hx    Stomach cancer Neg Hx    Colon polyps Neg Hx    Esophageal cancer Neg Hx    Rectal cancer Neg Hx     ALLERGIES:  has No Known Allergies.  MEDICATIONS:  Current Outpatient Medications  Medication Sig Dispense Refill   pantoprazole (PROTONIX) 20 MG tablet Take 1 tablet (20 mg total) by mouth daily. 90 tablet 3   No current facility-administered medications for this visit.     PHYSICAL EXAMINATION: ECOG PERFORMANCE STATUS: 0 - Asymptomatic  Vitals:   07/29/22 1445  BP: (!) 125/93  Pulse: 85  Resp: 16  Temp: 98.2 F (36.8 C)  SpO2: 100%   Filed Weights   07/29/22 1445  Weight: 127 lb 8 oz (57.8 kg)   Physical Exam Constitutional:  Appearance: Normal appearance.  HENT:     Head: Normocephalic and atraumatic.  Cardiovascular:     Rate and Rhythm: Normal rate and regular rhythm.     Pulses: Normal pulses.     Heart sounds: Normal heart sounds.  Musculoskeletal:     Cervical back: Normal range of motion and neck supple. No rigidity.  Lymphadenopathy:     Cervical: No cervical adenopathy.  Neurological:     Mental Status: She is alert.      LABORATORY DATA:  I have reviewed the data as listed Lab Results  Component Value Date   WBC 5.5 01/28/2022   HGB 11.6 (L) 01/28/2022   HCT 36.7 01/28/2022   MCV 73.0 (L) 01/28/2022   PLT 285 01/28/2022     Chemistry       Component Value Date/Time   NA 139 03/17/2016 1611   K 4.2 03/17/2016 1611   CL 105 03/17/2016 1611   CO2 27 03/17/2016 1611   BUN 6 03/17/2016 1611   CREATININE 0.71 03/17/2016 1611   CREATININE 0.51 05/01/2014 1835      Component Value Date/Time   CALCIUM 9.3 03/17/2016 1611   ALKPHOS 145 (H) 03/17/2016 1611   AST 18 03/17/2016 1611   ALT 53 (H) 03/17/2016 1611   BILITOT 0.4 03/17/2016 1611     Labs from today pending  RADIOGRAPHIC STUDIES: I have personally reviewed the radiological images as listed and agreed with the findings in the report. No results found.  All questions were answered. The patient knows to call the clinic with any problems, questions or concerns. I spent 20 minutes in the care of this patient including H and P, review of records, counseling and coordination of care.     Benay Pike, MD 07/29/2022 2:59 PM

## 2022-07-30 LAB — FERRITIN: Ferritin: 6 ng/mL — ABNORMAL LOW (ref 11–307)

## 2022-08-05 ENCOUNTER — Telehealth: Payer: Self-pay | Admitting: *Deleted

## 2022-08-05 NOTE — Telephone Encounter (Addendum)
-----   Message from Benay Pike, MD sent at 08/04/2022  5:25 PM EST ----- Ferritin still low at 6, please ask her to continue oral iron supplementation 65 mg/325 mg once every other day  Spoke with pt per above- she states she is not on iron supplement - on a mutli vitamin with low dose iron.  Discussed use of '65mg'$  iron every other day recommended. Pt verbalized understanding and agreement.

## 2022-08-09 ENCOUNTER — Other Ambulatory Visit: Payer: Self-pay | Admitting: Gastroenterology

## 2022-12-08 ENCOUNTER — Other Ambulatory Visit: Payer: Self-pay | Admitting: Family Medicine

## 2022-12-08 DIAGNOSIS — Z1231 Encounter for screening mammogram for malignant neoplasm of breast: Secondary | ICD-10-CM

## 2022-12-28 ENCOUNTER — Ambulatory Visit
Admission: RE | Admit: 2022-12-28 | Discharge: 2022-12-28 | Disposition: A | Discharge status: Home / Self Care | Payer: No Typology Code available for payment source | Source: Ambulatory Visit | Attending: Family Medicine | Admitting: Family Medicine

## 2022-12-28 DIAGNOSIS — Z1231 Encounter for screening mammogram for malignant neoplasm of breast: Secondary | ICD-10-CM

## 2023-05-14 ENCOUNTER — Other Ambulatory Visit: Payer: Self-pay | Admitting: Gastroenterology

## 2023-08-02 ENCOUNTER — Inpatient Hospital Stay: Payer: No Typology Code available for payment source | Admitting: Hematology and Oncology

## 2023-12-30 ENCOUNTER — Telehealth: Payer: Self-pay | Admitting: Internal Medicine

## 2023-12-30 MED ORDER — PANTOPRAZOLE SODIUM 20 MG PO TBEC: DELAYED_RELEASE_TABLET | ORAL | 0 refills | Status: DC

## 2023-12-30 NOTE — Telephone Encounter (Signed)
 Informed patient she is over due for a follow up visit. Patient scheduled to see Elida Shawl, NP for 02/29/24 at 3:00pm. Prescription for pantoprazole  sent to the pharmacy until scheduled appt.

## 2023-12-30 NOTE — Telephone Encounter (Signed)
 Patient requesting medication refill for pantoprazole .

## 2024-02-29 ENCOUNTER — Ambulatory Visit: Admitting: Nurse Practitioner

## 2024-02-29 ENCOUNTER — Encounter: Payer: Self-pay | Admitting: Nurse Practitioner

## 2024-02-29 VITALS — BP 120/80 | HR 80 | Ht 61.61 in | Wt 124.5 lb

## 2024-02-29 DIAGNOSIS — Z860102 Personal history of hyperplastic colon polyps: Secondary | ICD-10-CM

## 2024-02-29 DIAGNOSIS — K219 Gastro-esophageal reflux disease without esophagitis: Secondary | ICD-10-CM

## 2024-02-29 NOTE — Patient Instructions (Addendum)
 _______________________________________________________  If your blood pressure at your visit was 140/90 or greater, please contact your primary care physician to follow up on this.  _______________________________________________________  If you are age 50 or older, your body mass index should be between 23-30. Your Body mass index is 23.06 kg/m. If this is out of the aforementioned range listed, please consider follow up with your Primary Care Provider.  If you are age 20 or younger, your body mass index should be between 19-25. Your Body mass index is 23.06 kg/m. If this is out of the aformentioned range listed, please consider follow up with your Primary Care Provider.   ________________________________________________________  The [facility] providers would like to encourage you to use MYCHART to communicate with providers for non-urgent requests or questions.  Due to long hold times on the telephone, sending your provider a message by [facility] may be a faster and more efficient way to get a response.  Please allow 48 business hours for a response.  Please remember that this is for non-urgent requests.  _______________________________________________________  [facility] is using a team-based approach to care.  Your team is made up of your doctor and two to three APPS. Our APPS (Nurse Practitioners and Physician Assistants) work with your physician to ensure care continuity for you. They are fully qualified to address your health concerns and develop a treatment plan. They communicate directly with your gastroenterologist to care for you. Seeing the Advanced Practice Practitioners on your physician's team can help you by facilitating care more promptly, often allowing for earlier appointments, access to diagnostic testing, procedures, and other specialty referrals.    Ok to reduce Pantoprazole  20mg  once daily to be taken 30 minutes before breakfast for the next 2 months, if you  are not experiencing any upper abdominal pain or heartburn in 2 months you can stop Pantoprazole    Follow up in our office as needed   Your next screening colonoscopy is due November 2027  Thank you for trusting me with your gastrointestinal care!   Elida Shawl, CRNP

## 2024-02-29 NOTE — Progress Notes (Signed)
 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  She presents today for her annual visit.  She denies having any heartburn, dysphagia or abdominal pain.  She wishes to discuss weaning off Pantoprazole .  She previously took Pantoprazole  20 mg once daily which she stated was started previously due to having epigastric pain.  She reduced Pantoprazole  to 2 or 3 days weekly 1 month ago and started taking turmeric at that time without experiencing any reflux or upper abdominal pain.  EGD 03/2016 showed evidence of chronic inactive gastritis without evidence of H. pylori and duodenal biopsies were negative for celiac disease.  She underwent a colonoscopy on the same date and 3 hyperplastic polyps were removed from the colon.  She was advised to repeat a colonoscopy in 10 years.  No known family history of gastric or colorectal cancer.  Appetite is good.  Weight is stable.  No NSAID use.  Labs 12/22/2023: WBC 5.1. Hg 11.6 ( 11.1 - 15.9). HCT 37.3. PLT 318.  BUN 6.  Creatinine 0.56.  Total bili 0.3.  Alk phos 92.  AST 18.  ALT 13.  TSH 0.711.  Labs 06/26/2014: Hepatitis B surface antibody positive.  Hepatitis B core total antibody reactive.  Hepatitis E IgM antibody detected .  Hepatitis C IgG not detected.  Labs 05/01/2014: Hepatitis B surface antigen negative.  Hepatitis C antibody negative.      Latest Ref Rng & Units 07/29/2022    3:11 PM 01/28/2022    3:25 PM 12/30/2021    2:28 PM  CBC  WBC 4.0 - 10.5 K/uL 4.8  5.5  4.2   Hemoglobin 12.0 - 15.0 g/dL 88.3  88.3  8.2   Hematocrit 36.0 - 46.0 % 35.0  36.7  29.7   Platelets 150 - 400 K/uL 291  285  356      GI PROCEDURES:  EGD 04/06/2016: - Normal esophagus.  - Normal stomach. Biopsied.  - Normal examined duodenum. Biopsied.  Colonoscopy 04/06/2016: - The examined portion of the ileum was normal.  - One 5 mm polyp in the descending colon, removed with a cold snare. Resected and retrieved.  - One 4 mm polyp in the sigmoid colon, removed with a cold snare. Resected and retrieved.  - One 3 mm polyp in the rectum, removed with a cold biopsy forceps. Resected and retrieved.  - The examination was otherwise normal on direct and retroflexion views.   1. Surgical [P], duodenum, biopsy - BENIGN SMALL BOWEL MUCOSA. - NO ACTIVE INFLAMMATION OR VILLOUS ATROPHY IDENTIFIED. 2. Surgical [P], stomach, biopsy - MILD CHRONIC INACTIVE GASTRITIS. - THERE IS NO EVIDENCE OF HELICOBACTER PYLORI, DYSPLASIA OR MALIGNANCY. - SEE COMMENT. 3. Surgical [P], sigmoid and descending, polyp (2) - HYPERPLASTIC POLYP(S). - THERE IS NO EVIDENCE OF MALIGNANCY. 4. Surgical [P], rectum, polyp - HYPERPLASTIC POLYP(S). - THERE IS NO EVIDENCE OF MALIGNANCY.  Current Outpatient Medications on File Prior to Visit  Medication Sig Dispense Refill   pantoprazole  (PROTONIX ) 20 MG tablet TAKE 1 TABLET(20 MG) BY MOUTH DAILY 90 tablet 0   No current facility-administered medications on file prior to visit.   No Known Allergies  Current Medications,  Allergies, Past Medical History, Past Surgical History, Family History and Social History were reviewed in [employer] record.  Review of Systems:   Constitutional: Negative for fever, sweats, chills or weight loss.  Respiratory: Negative for shortness of breath.   Cardiovascular: Negative for chest pain, palpitations and leg swelling.  Gastrointestinal: See HPI.  Musculoskeletal: Negative for back pain or muscle aches.  Neurological: Negative for dizziness, headaches or paresthesias.   Physical Exam: BP 120/80 (BP Location: Left Arm, Patient Position:  Sitting, Cuff Size: Normal)   Pulse 80   Ht 5' 1.61 (1.565 m) Comment: height ,easured without shoes  Wt 124 lb 8 oz (56.5 kg)   LMP 02/22/2024   BMI 23.06 kg/m  General: 48 year old [place] female in no acute distress. Head: Normocephalic and atraumatic. Eyes: No scleral icterus. Conjunctiva pink . Ears: Normal auditory acuity. Mouth: Dentition intact. No ulcers or lesions.  Lungs: Clear throughout to auscultation. Heart: Regular rate and rhythm, no murmur. Abdomen: Soft, nontender and nondistended. No masses or hepatomegaly. Normal bowel sounds x 4 quadrants.  Rectal: Deferred. Musculoskeletal: Symmetrical with no gross deformities. Extremities: No edema. Neurological: Alert oriented x 4. No focal deficits.  Psychological: Alert and cooperative. Normal mood and affect  Assessment and Recommendations:  48 year old [place] female with a history of GERD. No reflux symptoms or epigastric pain.  Reduced Pantoprazole  to 2 to 3 days weekly and started turmeric 1 month ago.  Patient wishes to wean off Pantoprazole  at this time. - Pantoprazole  20 mg 1 capsule every other day for 2 months then stop - Patient to contact her office if she develops reflux symptoms or upper abdominal pain - Follow-up as needed  History of colon polyps.  Colonoscopy 03/2016 identified 3 hyperplastic polyps removed from the colon.  No known family history of colorectal cancer. - Next colonoscopy due 03/2026

## 2024-04-02 ENCOUNTER — Other Ambulatory Visit: Payer: Self-pay | Admitting: Internal Medicine
